# Patient Record
Sex: Female | Born: 1965
Health system: Southern US, Community
[De-identification: ages and names within clinical notes are randomized; demographics above are authoritative.]

## PROBLEM LIST (undated history)

## (undated) DIAGNOSIS — A630 Anogenital (venereal) warts: Secondary | ICD-10-CM

## (undated) DIAGNOSIS — R7303 Prediabetes: Secondary | ICD-10-CM

## (undated) DIAGNOSIS — K760 Fatty (change of) liver, not elsewhere classified: Secondary | ICD-10-CM

## (undated) DIAGNOSIS — E785 Hyperlipidemia, unspecified: Secondary | ICD-10-CM

## (undated) DIAGNOSIS — Z87442 Personal history of urinary calculi: Secondary | ICD-10-CM

## (undated) DIAGNOSIS — M1990 Unspecified osteoarthritis, unspecified site: Secondary | ICD-10-CM

## (undated) DIAGNOSIS — Z8489 Family history of other specified conditions: Secondary | ICD-10-CM

## (undated) DIAGNOSIS — I10 Essential (primary) hypertension: Secondary | ICD-10-CM

## (undated) DIAGNOSIS — G4730 Sleep apnea, unspecified: Secondary | ICD-10-CM

## (undated) DIAGNOSIS — R32 Unspecified urinary incontinence: Secondary | ICD-10-CM

## (undated) DIAGNOSIS — K589 Irritable bowel syndrome without diarrhea: Secondary | ICD-10-CM

## (undated) HISTORY — PX: EXTRACORPOREAL SHOCK WAVE LITHOTRIPSY: SHX1557

## (undated) HISTORY — DX: Irritable bowel syndrome without diarrhea: K58.9

## (undated) HISTORY — PX: EYE SURGERY: SHX253

## (undated) HISTORY — DX: Prediabetes: R73.03

## (undated) HISTORY — PX: APPENDECTOMY: SHX54

## (undated) HISTORY — DX: Essential (primary) hypertension: I10

## (undated) HISTORY — DX: Unspecified urinary incontinence: R32

## (undated) HISTORY — DX: Anogenital (venereal) warts: A63.0

## (undated) HISTORY — DX: Hyperlipidemia, unspecified: E78.5

## (undated) HISTORY — DX: Unspecified osteoarthritis, unspecified site: M19.90

## (undated) HISTORY — DX: Fatty (change of) liver, not elsewhere classified: K76.0

---

## 2001-02-09 ENCOUNTER — Ambulatory Visit (HOSPITAL_COMMUNITY): Admission: RE | Admit: 2001-02-09 | Discharge: 2001-02-09 | Payer: Self-pay | Admitting: Gastroenterology

## 2001-02-09 ENCOUNTER — Encounter (INDEPENDENT_AMBULATORY_CARE_PROVIDER_SITE_OTHER): Payer: Self-pay | Admitting: Specialist

## 2002-05-28 ENCOUNTER — Other Ambulatory Visit: Admission: RE | Admit: 2002-05-28 | Discharge: 2002-05-28 | Payer: Self-pay | Admitting: Obstetrics and Gynecology

## 2003-06-12 ENCOUNTER — Other Ambulatory Visit: Admission: RE | Admit: 2003-06-12 | Discharge: 2003-06-12 | Payer: Self-pay | Admitting: Obstetrics and Gynecology

## 2004-07-13 ENCOUNTER — Other Ambulatory Visit: Admission: RE | Admit: 2004-07-13 | Discharge: 2004-07-13 | Payer: Self-pay | Admitting: Obstetrics and Gynecology

## 2005-08-05 ENCOUNTER — Other Ambulatory Visit: Admission: RE | Admit: 2005-08-05 | Discharge: 2005-08-05 | Payer: Self-pay | Admitting: Obstetrics and Gynecology

## 2010-01-01 ENCOUNTER — Encounter: Admission: RE | Admit: 2010-01-01 | Discharge: 2010-01-01 | Payer: Self-pay | Admitting: Obstetrics and Gynecology

## 2014-11-28 HISTORY — PX: COLONOSCOPY: SHX174

## 2015-12-24 DIAGNOSIS — J069 Acute upper respiratory infection, unspecified: Secondary | ICD-10-CM | POA: Diagnosis not present

## 2015-12-24 DIAGNOSIS — J020 Streptococcal pharyngitis: Secondary | ICD-10-CM | POA: Diagnosis not present

## 2016-01-25 DIAGNOSIS — K080 Exfoliation of teeth due to systemic causes: Secondary | ICD-10-CM | POA: Diagnosis not present

## 2016-03-21 DIAGNOSIS — Z1389 Encounter for screening for other disorder: Secondary | ICD-10-CM | POA: Diagnosis not present

## 2016-03-21 DIAGNOSIS — R7301 Impaired fasting glucose: Secondary | ICD-10-CM | POA: Diagnosis not present

## 2016-03-21 DIAGNOSIS — I10 Essential (primary) hypertension: Secondary | ICD-10-CM | POA: Diagnosis not present

## 2016-03-21 DIAGNOSIS — E559 Vitamin D deficiency, unspecified: Secondary | ICD-10-CM | POA: Diagnosis not present

## 2016-03-21 DIAGNOSIS — E785 Hyperlipidemia, unspecified: Secondary | ICD-10-CM | POA: Diagnosis not present

## 2016-03-21 DIAGNOSIS — R809 Proteinuria, unspecified: Secondary | ICD-10-CM | POA: Diagnosis not present

## 2016-04-26 DIAGNOSIS — S61419A Laceration without foreign body of unspecified hand, initial encounter: Secondary | ICD-10-CM | POA: Diagnosis not present

## 2016-04-26 DIAGNOSIS — Z23 Encounter for immunization: Secondary | ICD-10-CM | POA: Diagnosis not present

## 2016-04-26 DIAGNOSIS — S61459A Open bite of unspecified hand, initial encounter: Secondary | ICD-10-CM | POA: Diagnosis not present

## 2016-05-03 DIAGNOSIS — S61451A Open bite of right hand, initial encounter: Secondary | ICD-10-CM | POA: Diagnosis not present

## 2016-05-03 DIAGNOSIS — W540XXA Bitten by dog, initial encounter: Secondary | ICD-10-CM | POA: Diagnosis not present

## 2016-07-07 ENCOUNTER — Ambulatory Visit (INDEPENDENT_AMBULATORY_CARE_PROVIDER_SITE_OTHER): Payer: Federal, State, Local not specified - PPO | Admitting: Obstetrics and Gynecology

## 2016-07-07 ENCOUNTER — Encounter: Payer: Self-pay | Admitting: Obstetrics and Gynecology

## 2016-07-07 VITALS — BP 122/74 | HR 70 | Resp 18 | Ht 65.0 in | Wt 249.0 lb

## 2016-07-07 DIAGNOSIS — E669 Obesity, unspecified: Secondary | ICD-10-CM | POA: Diagnosis not present

## 2016-07-07 DIAGNOSIS — N393 Stress incontinence (female) (male): Secondary | ICD-10-CM | POA: Diagnosis not present

## 2016-07-07 DIAGNOSIS — Z01419 Encounter for gynecological examination (general) (routine) without abnormal findings: Secondary | ICD-10-CM

## 2016-07-07 DIAGNOSIS — Z6841 Body Mass Index (BMI) 40.0 and over, adult: Secondary | ICD-10-CM | POA: Diagnosis not present

## 2016-07-07 DIAGNOSIS — IMO0001 Reserved for inherently not codable concepts without codable children: Secondary | ICD-10-CM

## 2016-07-07 NOTE — Patient Instructions (Signed)

## 2016-07-07 NOTE — Progress Notes (Signed)
51 y.o. G26P2012 Married Caucasian female here for annual exam.    Menses are not monthly.  LMP 07/03/16.  Lasted 5 days and had cramping.  Usually not more than 7 days.   Prior cycle was 96 days.  Prior cycle was 48 days.  Was monthly prior to that. Hot flashes for years.   Has low energy.  Takes Phentermine, and this helps.  Has lost a lot of weight in the past just on her own.  Did Weight Watchers in past and did not work.  Feels like she is retaining fluid in her legs.  States she may have fatty liver.  PCP:   W.Douglas Tomasa Blase, MD - [place], [place].  Patient's last menstrual period was 07/03/2016 (exact date).     Period Pattern: (!) Irregular     Sexually active: Yes.   female The current method of family planning is condoms sometimes.    Exercising: Yes.    cardio Smoker:  Former, quit 1996  Health Maintenance: Pap:  2015 normal per patient History of abnormal Pap:  No.  Hx of genital warts on her cervix. MMG:  2015 normal with [facility] Colonoscopy:  2014 normal in [place];next due 2019 BMD:   n/a  Result  n/a TDaP:  04/2016 Gardasil:   N/A Hep C:  Will d/w PCP.  Screening Labs:  Hb today: PCP, Urine today: unable to void   reports that she quit smoking about 22 years ago. Her smoking use included Cigarettes. She has never used smokeless tobacco. She reports that she does not drink alcohol or use drugs.  Past Medical History:  Diagnosis Date  . Genital warts    removed from cervix in her 20's  . Hypertension   . Urinary incontinence     Past Surgical History:  Procedure Laterality Date  . APPENDECTOMY      Current Outpatient Prescriptions  Medication Sig Dispense Refill  . benazepril (LOTENSIN) 20 MG tablet Take 1 tablet by mouth daily.    . naproxen (NAPROSYN) 500 MG tablet Take 1 tablet by mouth daily.    . phentermine (ADIPEX-P) 37.5 MG tablet Take 1 tablet by mouth daily.    . Vitamin D, Ergocalciferol, (DRISDOL) 50000 units CAPS capsule  Take 1 capsule by mouth once a week.    . vitamin E 400 UNIT capsule Take 400 Units by mouth daily.     No current facility-administered medications for this visit.     Family History  Problem Relation Age of Onset  . Cancer Mother 41    colon cancer  . Diabetes Mother   . Hypertension Mother   . Cancer Father 75    colon cancer  . Diabetes Father   . Stroke Father   . Heart failure Father   . Other Sister     died from overdose age 30    ROS:  Pertinent items are noted in HPI.  Otherwise, a comprehensive ROS was negative.  Exam:   BP 122/74 (BP Location: Left Arm, Patient Position: Sitting, Cuff Size: Large)   Pulse 70   Resp 18   Ht 5\' 5"  (1.651 m)   Wt 249 lb (112.9 kg)   LMP 07/03/2016 (Exact Date)   BMI 41.44 kg/m     General appearance: alert, cooperative and appears stated age Head: Normocephalic, without obvious abnormality, atraumatic Neck: no adenopathy, supple, symmetrical, trachea midline and thyroid normal to inspection and palpation Lungs: clear to auscultation bilaterally Breasts: normal appearance, no masses or  tenderness, No nipple retraction or dimpling, No nipple discharge or bleeding, No axillary or supraclavicular adenopathy Heart: regular rate and rhythm Abdomen: soft, non-tender; no masses, no organomegaly Extremities: extremities normal, atraumatic, no cyanosis or edema Skin: Skin color, texture, turgor normal. No rashes or lesions Lymph nodes: Cervical, supraclavicular, and axillary nodes normal. No abnormal inguinal nodes palpated Neurologic: Grossly normal  Pelvic: External genitalia:  no lesions              Urethra:  normal appearing urethra with no masses, tenderness or lesions              Bartholins and Skenes: normal                 Vagina: normal appearing vagina with normal color and discharge, no lesions, Minimal cystocele.              Cervix: no lesions              Pap taken: Yes.   Bimanual Exam:  Uterus:  normal size,  contour, position, consistency, mobility, non-tender              Adnexa: no mass, fullness, tenderness              Rectal exam: Yes.  .  Confirms.              Anus:  normal sphincter tone, no lesions  Chaperone was present for exam.  Assessment:   Well woman visit with normal exam. Obesity. Remote history of genital warts. Stress incontinence.   Plan: Mammogram screening discussed. Recommended self breast awareness. Pap and HR HPV as above. Guidelines for Calcium, Vitamin D, regular exercise program including cardiovascular and weight bearing exercise. Extensive discussion regarding weight loss - methods of weight loss, importance of both calorie restriction and exercise reviewed.  Benefits of weight loss to reduce risk of DVD, diabetes, urinary incontinence. Urinary incontinence discussed as well.  Etiologies reviewed.  Tx options of Kegels, pelvic floor PT, Impressa, midurethral sling and cystocele repair.  I also stressed the importance of weight loss as part of the plan.  Midurethral sling will have less effectiveness and patient will have higher chance of return of incontinence without weight loss first. Will refer to PT. Follow up annually and prn.   Additional counseling given.  Yes.  . ___20___ minutes face to face time of which over 50% was spent in counseling regarding stress incontinence and weight loss.   1 hour of physician face to face time was spent with the patient today.   After visit summary provided.

## 2016-07-08 ENCOUNTER — Encounter: Payer: Self-pay | Admitting: Obstetrics and Gynecology

## 2016-07-11 LAB — IPS PAP TEST WITH HPV

## 2016-08-08 DIAGNOSIS — K080 Exfoliation of teeth due to systemic causes: Secondary | ICD-10-CM | POA: Diagnosis not present

## 2016-09-12 DIAGNOSIS — E785 Hyperlipidemia, unspecified: Secondary | ICD-10-CM | POA: Diagnosis not present

## 2016-09-12 DIAGNOSIS — E559 Vitamin D deficiency, unspecified: Secondary | ICD-10-CM | POA: Diagnosis not present

## 2016-09-12 DIAGNOSIS — Z0000 Encounter for general adult medical examination without abnormal findings: Secondary | ICD-10-CM | POA: Diagnosis not present

## 2016-09-12 DIAGNOSIS — I10 Essential (primary) hypertension: Secondary | ICD-10-CM | POA: Diagnosis not present

## 2017-02-13 DIAGNOSIS — K080 Exfoliation of teeth due to systemic causes: Secondary | ICD-10-CM | POA: Diagnosis not present

## 2017-03-20 DIAGNOSIS — R7301 Impaired fasting glucose: Secondary | ICD-10-CM | POA: Diagnosis not present

## 2017-03-20 DIAGNOSIS — E559 Vitamin D deficiency, unspecified: Secondary | ICD-10-CM | POA: Diagnosis not present

## 2017-03-20 DIAGNOSIS — I10 Essential (primary) hypertension: Secondary | ICD-10-CM | POA: Diagnosis not present

## 2017-03-20 DIAGNOSIS — Z6841 Body Mass Index (BMI) 40.0 and over, adult: Secondary | ICD-10-CM | POA: Diagnosis not present

## 2017-03-20 DIAGNOSIS — Z1389 Encounter for screening for other disorder: Secondary | ICD-10-CM | POA: Diagnosis not present

## 2017-03-20 DIAGNOSIS — E785 Hyperlipidemia, unspecified: Secondary | ICD-10-CM | POA: Diagnosis not present

## 2017-04-25 DIAGNOSIS — M1812 Unilateral primary osteoarthritis of first carpometacarpal joint, left hand: Secondary | ICD-10-CM | POA: Diagnosis not present

## 2017-04-25 DIAGNOSIS — M1811 Unilateral primary osteoarthritis of first carpometacarpal joint, right hand: Secondary | ICD-10-CM | POA: Diagnosis not present

## 2017-05-15 DIAGNOSIS — G4700 Insomnia, unspecified: Secondary | ICD-10-CM | POA: Diagnosis not present

## 2017-05-15 DIAGNOSIS — R0683 Snoring: Secondary | ICD-10-CM | POA: Diagnosis not present

## 2017-06-01 DIAGNOSIS — G4733 Obstructive sleep apnea (adult) (pediatric): Secondary | ICD-10-CM | POA: Diagnosis not present

## 2017-06-09 DIAGNOSIS — Z6841 Body Mass Index (BMI) 40.0 and over, adult: Secondary | ICD-10-CM | POA: Diagnosis not present

## 2017-06-09 DIAGNOSIS — Z01419 Encounter for gynecological examination (general) (routine) without abnormal findings: Secondary | ICD-10-CM | POA: Diagnosis not present

## 2017-06-09 DIAGNOSIS — Z1231 Encounter for screening mammogram for malignant neoplasm of breast: Secondary | ICD-10-CM | POA: Diagnosis not present

## 2017-06-13 DIAGNOSIS — G4733 Obstructive sleep apnea (adult) (pediatric): Secondary | ICD-10-CM | POA: Diagnosis not present

## 2017-06-16 ENCOUNTER — Other Ambulatory Visit: Payer: Self-pay | Admitting: Obstetrics

## 2017-06-16 DIAGNOSIS — R928 Other abnormal and inconclusive findings on diagnostic imaging of breast: Secondary | ICD-10-CM

## 2017-06-22 ENCOUNTER — Ambulatory Visit
Admission: RE | Admit: 2017-06-22 | Discharge: 2017-06-22 | Disposition: A | Discharge status: Home / Self Care | Payer: Federal, State, Local not specified - PPO | Source: Ambulatory Visit | Attending: Obstetrics | Admitting: Obstetrics

## 2017-06-22 ENCOUNTER — Ambulatory Visit: Payer: Self-pay

## 2017-06-22 DIAGNOSIS — R928 Other abnormal and inconclusive findings on diagnostic imaging of breast: Secondary | ICD-10-CM

## 2017-07-14 DIAGNOSIS — G4733 Obstructive sleep apnea (adult) (pediatric): Secondary | ICD-10-CM | POA: Diagnosis not present

## 2017-07-19 ENCOUNTER — Ambulatory Visit: Payer: Federal, State, Local not specified - PPO | Admitting: Obstetrics and Gynecology

## 2017-08-14 DIAGNOSIS — G4733 Obstructive sleep apnea (adult) (pediatric): Secondary | ICD-10-CM | POA: Diagnosis not present

## 2017-08-22 DIAGNOSIS — K080 Exfoliation of teeth due to systemic causes: Secondary | ICD-10-CM | POA: Diagnosis not present

## 2017-09-11 DIAGNOSIS — G4733 Obstructive sleep apnea (adult) (pediatric): Secondary | ICD-10-CM | POA: Diagnosis not present

## 2017-09-18 DIAGNOSIS — Z6841 Body Mass Index (BMI) 40.0 and over, adult: Secondary | ICD-10-CM | POA: Diagnosis not present

## 2017-09-18 DIAGNOSIS — R7301 Impaired fasting glucose: Secondary | ICD-10-CM | POA: Diagnosis not present

## 2017-09-18 DIAGNOSIS — E559 Vitamin D deficiency, unspecified: Secondary | ICD-10-CM | POA: Diagnosis not present

## 2017-09-18 DIAGNOSIS — E785 Hyperlipidemia, unspecified: Secondary | ICD-10-CM | POA: Diagnosis not present

## 2017-09-18 DIAGNOSIS — I10 Essential (primary) hypertension: Secondary | ICD-10-CM | POA: Diagnosis not present

## 2017-09-18 DIAGNOSIS — R809 Proteinuria, unspecified: Secondary | ICD-10-CM | POA: Diagnosis not present

## 2017-09-20 DIAGNOSIS — G4733 Obstructive sleep apnea (adult) (pediatric): Secondary | ICD-10-CM | POA: Diagnosis not present

## 2017-09-25 DIAGNOSIS — G4733 Obstructive sleep apnea (adult) (pediatric): Secondary | ICD-10-CM | POA: Diagnosis not present

## 2017-09-26 DIAGNOSIS — G4733 Obstructive sleep apnea (adult) (pediatric): Secondary | ICD-10-CM | POA: Diagnosis not present

## 2017-10-12 DIAGNOSIS — G4733 Obstructive sleep apnea (adult) (pediatric): Secondary | ICD-10-CM | POA: Diagnosis not present

## 2017-10-30 DIAGNOSIS — Z780 Asymptomatic menopausal state: Secondary | ICD-10-CM | POA: Diagnosis not present

## 2017-11-01 ENCOUNTER — Encounter: Payer: Self-pay | Admitting: Gastroenterology

## 2017-11-11 DIAGNOSIS — G4733 Obstructive sleep apnea (adult) (pediatric): Secondary | ICD-10-CM | POA: Diagnosis not present

## 2017-12-12 DIAGNOSIS — G4733 Obstructive sleep apnea (adult) (pediatric): Secondary | ICD-10-CM | POA: Diagnosis not present

## 2017-12-22 DIAGNOSIS — T63301A Toxic effect of unspecified spider venom, accidental (unintentional), initial encounter: Secondary | ICD-10-CM | POA: Diagnosis not present

## 2018-01-02 DIAGNOSIS — G4733 Obstructive sleep apnea (adult) (pediatric): Secondary | ICD-10-CM | POA: Diagnosis not present

## 2018-01-11 DIAGNOSIS — G4733 Obstructive sleep apnea (adult) (pediatric): Secondary | ICD-10-CM | POA: Diagnosis not present

## 2018-03-26 DIAGNOSIS — R809 Proteinuria, unspecified: Secondary | ICD-10-CM | POA: Diagnosis not present

## 2018-03-26 DIAGNOSIS — Z1331 Encounter for screening for depression: Secondary | ICD-10-CM | POA: Diagnosis not present

## 2018-03-26 DIAGNOSIS — Z1339 Encounter for screening examination for other mental health and behavioral disorders: Secondary | ICD-10-CM | POA: Diagnosis not present

## 2018-03-26 DIAGNOSIS — I10 Essential (primary) hypertension: Secondary | ICD-10-CM | POA: Diagnosis not present

## 2018-03-26 DIAGNOSIS — E785 Hyperlipidemia, unspecified: Secondary | ICD-10-CM | POA: Diagnosis not present

## 2018-03-26 DIAGNOSIS — E559 Vitamin D deficiency, unspecified: Secondary | ICD-10-CM | POA: Diagnosis not present

## 2018-03-26 DIAGNOSIS — Z0000 Encounter for general adult medical examination without abnormal findings: Secondary | ICD-10-CM | POA: Diagnosis not present

## 2018-03-28 DIAGNOSIS — K080 Exfoliation of teeth due to systemic causes: Secondary | ICD-10-CM | POA: Diagnosis not present

## 2018-06-11 DIAGNOSIS — G4733 Obstructive sleep apnea (adult) (pediatric): Secondary | ICD-10-CM | POA: Diagnosis not present

## 2018-07-11 DIAGNOSIS — Z1231 Encounter for screening mammogram for malignant neoplasm of breast: Secondary | ICD-10-CM | POA: Diagnosis not present

## 2018-07-11 DIAGNOSIS — Z124 Encounter for screening for malignant neoplasm of cervix: Secondary | ICD-10-CM | POA: Diagnosis not present

## 2018-07-11 DIAGNOSIS — Z01419 Encounter for gynecological examination (general) (routine) without abnormal findings: Secondary | ICD-10-CM | POA: Diagnosis not present

## 2018-07-11 DIAGNOSIS — Z6841 Body Mass Index (BMI) 40.0 and over, adult: Secondary | ICD-10-CM | POA: Diagnosis not present

## 2018-07-31 ENCOUNTER — Encounter: Payer: Self-pay | Admitting: Gastroenterology

## 2018-07-31 ENCOUNTER — Ambulatory Visit: Payer: Federal, State, Local not specified - PPO | Admitting: Gastroenterology

## 2018-08-01 ENCOUNTER — Ambulatory Visit: Payer: Federal, State, Local not specified - PPO | Admitting: Gastroenterology

## 2018-08-01 ENCOUNTER — Encounter: Payer: Self-pay | Admitting: Gastroenterology

## 2018-08-01 VITALS — BP 128/78 | HR 72 | Ht 65.0 in | Wt 266.1 lb

## 2018-08-01 DIAGNOSIS — R1032 Left lower quadrant pain: Secondary | ICD-10-CM | POA: Diagnosis not present

## 2018-08-01 MED ORDER — METRONIDAZOLE 500 MG PO TABS: 500.0000 mg | ORAL_TABLET | Freq: Two times a day (BID) | ORAL | 0 refills | Status: AC

## 2018-08-01 MED ORDER — CIPROFLOXACIN HCL 500 MG PO TABS: 500.0000 mg | ORAL_TABLET | Freq: Two times a day (BID) | ORAL | 0 refills | Status: AC

## 2018-08-01 MED ORDER — FLUCONAZOLE 100 MG PO TABS: 100.0000 mg | ORAL_TABLET | Freq: Once | ORAL | 0 refills | Status: AC

## 2018-08-01 NOTE — Progress Notes (Signed)
Chief Complaint: Left lower quad abdominal pain.  Referring Provider:  Paulina Fusi, MD      ASSESSMENT AND PLAN;   #1. LLQ pain -highly s/o diverticulitis.  Rule out other causes.  #2. FH of colon cancer (mother and father at appox age 55). Neg colon 11/2014 except for moderate sigmoid diverticulosis.   Plan: - Start cipro 500mg  po bid and flagyl 500mg  po tid x 10 days. - Diflucan 200mg  po 1 tab (as she has Candida vaginitis when she takes antibiotics).  This is to be used only if she starts having any problems. - Call in 2 weeks. - If still with problems, check CBC, CMP, NCCT (d/t h/o kidney stones) followed by CECT abdo/pelvis. - Colon 11/2019, Earlier if still with problems.    HPI:    Brandi Simmons is a 53 y.o. female  With left lower quadrant abdominal pain-intermittently over the last 2 to 3 months. Nonradiating Had one episode of chills Denies having any significant diarrhea or constipation No abdominal bloating No melena or hematochezia Has been given dicyclomine without any significant relief.  Gets nauseated when the pain is severe.  No vomiting.  No weight loss.  -GI procedures: Colonoscopy 11/2014 moderate sigmoid diverticulosis.  Was recommended to repeat in 3 years.  However, patient agreeable to repeat in 5 years.  Certainly, earlier if still with problems. Past Medical History:  Diagnosis Date  . Borderline diabetes mellitus   . Fatty liver   . Genital warts    removed from cervix in her 20's  . HTN (hypertension)   . Hyperlipidemia   . Hypertension   . IBS (irritable bowel syndrome)   . Osteoarthritis   . Urinary incontinence     Past Surgical History:  Procedure Laterality Date  . APPENDECTOMY    . COLONOSCOPY  11/28/2014   Moderate sigmoid diverticilosis. Small internal hemorrhoids. Otherwise normal colonoscopy.     Family History  Problem Relation Age of Onset  . Cancer Mother 70       colon cancer  . Diabetes Mother   .  Hypertension Mother   . Cancer Father 57       colon cancer  . Diabetes Father   . Stroke Father   . Heart failure Father   . Other Sister        died from overdose age 22  . Esophageal cancer Neg Hx   . Breast cancer Neg Hx     Social History   Tobacco Use  . Smoking status: Former Smoker    Types: Cigarettes    Last attempt to quit: 07/04/1994    Years since quitting: 24.0  . Smokeless tobacco: Never Used  Substance Use Topics  . Alcohol use: No  . Drug use: No    Current Outpatient Medications  Medication Sig Dispense Refill  . benazepril (LOTENSIN) 20 MG tablet Take 1 tablet by mouth daily.    . naproxen (NAPROSYN) 500 MG tablet Take 1 tablet by mouth daily.    . phentermine (ADIPEX-P) 37.5 MG tablet Take 1 tablet by mouth daily.    . Vitamin D, Ergocalciferol, (DRISDOL) 50000 units CAPS capsule Take 1 capsule by mouth once a week.    . dicyclomine (BENTYL) 20 MG tablet as needed.      No current facility-administered medications for this visit.     No Known Allergies  Review of Systems:  Constitutional: Denies fever, chills, diaphoresis, appetite change and fatigue.  HEENT: Denies photophobia, eye  pain, redness, hearing loss, ear pain, congestion, sore throat, rhinorrhea, sneezing, mouth sores, neck pain, neck stiffness and tinnitus.   Respiratory: Denies SOB, DOE, cough, chest tightness,  and wheezing.   Cardiovascular: Denies chest pain, palpitations and leg swelling.  Genitourinary: Denies dysuria, urgency, frequency, hematuria, flank pain and difficulty urinating.  Musculoskeletal: Denies myalgias, back pain, joint swelling, arthralgias and gait problem.  Skin: No rash.  Neurological: Denies dizziness, seizures, syncope, weakness, light-headedness, numbness and headaches.  Hematological: Denies adenopathy. Easy bruising, personal or family bleeding history  Psychiatric/Behavioral:Has anxiety, no depression     Physical Exam:    BP 128/78   Pulse 72   Ht  5\' 5"  (1.651 m)   Wt 266 lb 2 oz (120.7 kg)   BMI 44.29 kg/m  Filed Weights   08/01/18 1059  Weight: 266 lb 2 oz (120.7 kg)   Constitutional:  Well-developed, in no acute distress. Psychiatric: Normal mood and affect. Behavior is normal. HEENT: Pupils normal.  Conjunctivae are normal. No scleral icterus. Neck supple.  Cardiovascular: Normal rate, regular rhythm. No edema Pulmonary/chest: Effort normal and breath sounds normal. No wheezing, rales or rhonchi. Abdominal: Soft, nondistended.  Mild left lower quadrant tenderness without rebound. Bowel sounds active throughout. There are no masses palpable. No hepatomegaly. Rectal:  defered Neurological: Alert and oriented to person place and time. Skin: Skin is warm and dry. No rashes noted.    Edman Circle, MD 08/01/2018, 11:09 AM  Cc: Paulina Fusi, MD

## 2018-08-01 NOTE — Patient Instructions (Addendum)
If you are age 55 or older, your body mass index should be between 23-30. Your Body mass index is 44.29 kg/m. If this is out of the aforementioned range listed, please consider follow up with your Primary Care Provider.  If you are age 69 or younger, your body mass index should be between 19-25. Your Body mass index is 44.29 kg/m. If this is out of the aformentioned range listed, please consider follow up with your Primary Care Provider.   We have sent the following medications to your pharmacy for you to pick up at your convenience: Cipro Flagyl Diflucan  Please call Dr. Donne Hazel nurse Veronia Beets, RN)  in 2 weeks at [phone]  to let her now how you are doing.   You will be due for a recall colonoscopy in 11/2019. We will send you a reminder in the mail when it gets closer to that time.   Thank you,  Dr. Lynann Bologna

## 2018-08-13 ENCOUNTER — Telehealth: Payer: Self-pay | Admitting: Gastroenterology

## 2018-08-13 NOTE — Telephone Encounter (Signed)
[facility] OF THE DAY Brandi Simmons patient-patient called into office to report she is still having LLQ problems and wishes to proceed with the next step in the plan of care that Dr. Chales Simmons "told her about"; patient was informed the DOD would be notified of situation and patient would receive a phone call in return-  Patient prematurely scheduled for lab work at [facility] for CBC/CMP/NCCT;  Orders NOT placed in Epic at this time;  Please advise as the last OV note on 08/01/2018 stated: Call in 2 weeks. - If still with problems, check CBC, CMP, NCCT (d/t h/o kidney stones) followed by CECT abdo/pelvis.

## 2018-08-14 ENCOUNTER — Ambulatory Visit (HOSPITAL_BASED_OUTPATIENT_CLINIC_OR_DEPARTMENT_OTHER)
Admission: RE | Admit: 2018-08-14 | Discharge: 2018-08-14 | Disposition: A | Discharge status: Home / Self Care | Payer: Federal, State, Local not specified - PPO | Source: Ambulatory Visit | Attending: Gastroenterology | Admitting: Gastroenterology

## 2018-08-14 ENCOUNTER — Other Ambulatory Visit (INDEPENDENT_AMBULATORY_CARE_PROVIDER_SITE_OTHER): Payer: Federal, State, Local not specified - PPO

## 2018-08-14 ENCOUNTER — Other Ambulatory Visit: Payer: Self-pay

## 2018-08-14 DIAGNOSIS — N200 Calculus of kidney: Secondary | ICD-10-CM | POA: Diagnosis not present

## 2018-08-14 DIAGNOSIS — R1032 Left lower quadrant pain: Secondary | ICD-10-CM

## 2018-08-14 LAB — CBC WITH DIFFERENTIAL/PLATELET
BASOS ABS: 0.1 10*3/uL (ref 0.0–0.1)
Basophils Relative: 0.9 % (ref 0.0–3.0)
EOS ABS: 0.3 10*3/uL (ref 0.0–0.7)
Eosinophils Relative: 3.4 % (ref 0.0–5.0)
HEMATOCRIT: 40.9 % (ref 36.0–46.0)
Hemoglobin: 13.8 g/dL (ref 12.0–15.0)
LYMPHS PCT: 30.1 % (ref 12.0–46.0)
Lymphs Abs: 2.5 10*3/uL (ref 0.7–4.0)
MCHC: 33.7 g/dL (ref 30.0–36.0)
MCV: 86.3 fl (ref 78.0–100.0)
MONOS PCT: 5.1 % (ref 3.0–12.0)
Monocytes Absolute: 0.4 10*3/uL (ref 0.1–1.0)
Neutro Abs: 5 10*3/uL (ref 1.4–7.7)
Neutrophils Relative %: 60.5 % (ref 43.0–77.0)
Platelets: 271 10*3/uL (ref 150.0–400.0)
RBC: 4.74 Mil/uL (ref 3.87–5.11)
RDW: 12.8 % (ref 11.5–15.5)
WBC: 8.3 10*3/uL (ref 4.0–10.5)

## 2018-08-14 LAB — COMPREHENSIVE METABOLIC PANEL
ALBUMIN: 4.1 g/dL (ref 3.5–5.2)
ALK PHOS: 81 U/L (ref 39–117)
ALT: 20 U/L (ref 0–35)
AST: 18 U/L (ref 0–37)
BILIRUBIN TOTAL: 0.5 mg/dL (ref 0.2–1.2)
BUN: 15 mg/dL (ref 6–23)
CALCIUM: 9.4 mg/dL (ref 8.4–10.5)
CO2: 27 mEq/L (ref 19–32)
CREATININE: 0.72 mg/dL (ref 0.40–1.20)
Chloride: 101 mEq/L (ref 96–112)
GFR: 84.2 mL/min (ref >60.00)
Glucose, Bld: 145 mg/dL — ABNORMAL HIGH (ref 70–99)
Potassium: 4.1 mEq/L (ref 3.5–5.1)
Sodium: 137 mEq/L (ref 135–145)
TOTAL PROTEIN: 6.7 g/dL (ref 6.0–8.3)

## 2018-08-14 NOTE — Telephone Encounter (Signed)
Called and spoke with patient-patient informed of MD (DOD) recommendations and patient is agreeable with plan of care; patient was informed of lab work being requested and appt being scheduled for 08/14/2018 arrival at 1:00pm and appt at 1:15pm; patient is also aware of non-contrast CT abd/pelvis being scheduled 08/14/2018 at 2:00pm; Patient verbalized understanding of information/instructions; Patient was advised to call back if questions/concerns arise;

## 2018-08-14 NOTE — Telephone Encounter (Signed)
Previous notes reviewed from last appointment Dr. Chales Abrahams.  Given ongoing LLQ pain despite treatment with appropriate course of antibiotics, agree with further serologic and radiographic evaluation as previously described by Dr. Chales Abrahams  -Check CBC, CMP - Cross-sectional imaging as previously requested by Dr. Chales Abrahams to evaluate for both kidney stones along with diverticulitis, diverticular associated colitis, inflammatory changes, etc.  He had requested for noncontrast CT to evaluate for kidney stones, and presumably if unrevealing would proceed with contrasted CT abdomen/pelvis to evaluate for GI/luminal etiology.  Please order as such per his request and previous notes.  Thank you

## 2018-08-15 ENCOUNTER — Telehealth: Payer: Self-pay | Admitting: Gastroenterology

## 2018-08-15 NOTE — Telephone Encounter (Signed)
Please review previous message and advise 

## 2018-08-15 NOTE — Telephone Encounter (Signed)
Patient wants ct results

## 2018-08-16 NOTE — Telephone Encounter (Signed)
Patient contacted by Woodroe Mode and referral placed.

## 2018-08-21 DIAGNOSIS — R8271 Bacteriuria: Secondary | ICD-10-CM | POA: Diagnosis not present

## 2018-08-21 DIAGNOSIS — N200 Calculus of kidney: Secondary | ICD-10-CM | POA: Diagnosis not present

## 2018-08-22 ENCOUNTER — Encounter (HOSPITAL_BASED_OUTPATIENT_CLINIC_OR_DEPARTMENT_OTHER): Payer: Self-pay | Admitting: *Deleted

## 2018-08-22 ENCOUNTER — Other Ambulatory Visit: Payer: Self-pay

## 2018-08-22 NOTE — Progress Notes (Signed)
Spoke w pt.  Hx obtained.  Instructed pt to be NPO p MN tonight (no candy, mint, or gum).  To [facility] 2/10 @ 0700.cbc, cmet in epic.  Needs urine hcg on arrival. No  Orders. unable to notify office as it is closed.

## 2018-08-23 ENCOUNTER — Other Ambulatory Visit: Payer: Self-pay

## 2018-08-23 ENCOUNTER — Encounter (HOSPITAL_BASED_OUTPATIENT_CLINIC_OR_DEPARTMENT_OTHER)
Admission: RE | Disposition: A | Discharge status: Home / Self Care | Payer: Self-pay | Source: Home / Self Care | Attending: Urology

## 2018-08-23 ENCOUNTER — Encounter (HOSPITAL_BASED_OUTPATIENT_CLINIC_OR_DEPARTMENT_OTHER): Payer: Self-pay | Admitting: Anesthesiology

## 2018-08-23 ENCOUNTER — Ambulatory Visit (HOSPITAL_BASED_OUTPATIENT_CLINIC_OR_DEPARTMENT_OTHER): Payer: Federal, State, Local not specified - PPO | Admitting: Anesthesiology

## 2018-08-23 ENCOUNTER — Ambulatory Visit (HOSPITAL_BASED_OUTPATIENT_CLINIC_OR_DEPARTMENT_OTHER)
Admission: RE | Admit: 2018-08-23 | Discharge: 2018-08-23 | Disposition: A | Discharge status: Home / Self Care | Payer: Federal, State, Local not specified - PPO | Attending: Urology | Admitting: Urology

## 2018-08-23 DIAGNOSIS — Z87891 Personal history of nicotine dependence: Secondary | ICD-10-CM | POA: Diagnosis not present

## 2018-08-23 DIAGNOSIS — G4730 Sleep apnea, unspecified: Secondary | ICD-10-CM | POA: Insufficient documentation

## 2018-08-23 DIAGNOSIS — Z6841 Body Mass Index (BMI) 40.0 and over, adult: Secondary | ICD-10-CM | POA: Insufficient documentation

## 2018-08-23 DIAGNOSIS — Z791 Long term (current) use of non-steroidal anti-inflammatories (NSAID): Secondary | ICD-10-CM | POA: Insufficient documentation

## 2018-08-23 DIAGNOSIS — N202 Calculus of kidney with calculus of ureter: Secondary | ICD-10-CM | POA: Diagnosis not present

## 2018-08-23 DIAGNOSIS — Z8249 Family history of ischemic heart disease and other diseases of the circulatory system: Secondary | ICD-10-CM | POA: Insufficient documentation

## 2018-08-23 DIAGNOSIS — N201 Calculus of ureter: Secondary | ICD-10-CM | POA: Diagnosis not present

## 2018-08-23 DIAGNOSIS — I10 Essential (primary) hypertension: Secondary | ICD-10-CM | POA: Diagnosis not present

## 2018-08-23 DIAGNOSIS — M1990 Unspecified osteoarthritis, unspecified site: Secondary | ICD-10-CM | POA: Insufficient documentation

## 2018-08-23 DIAGNOSIS — N200 Calculus of kidney: Secondary | ICD-10-CM

## 2018-08-23 HISTORY — PX: CYSTOSCOPY/URETEROSCOPY/HOLMIUM LASER/STENT PLACEMENT: SHX6546

## 2018-08-23 HISTORY — DX: Prediabetes: R73.03

## 2018-08-23 HISTORY — DX: Sleep apnea, unspecified: G47.30

## 2018-08-23 HISTORY — DX: Family history of other specified conditions: Z84.89

## 2018-08-23 HISTORY — DX: Personal history of urinary calculi: Z87.442

## 2018-08-23 LAB — POCT PREGNANCY, URINE: Preg Test, Ur: NEGATIVE

## 2018-08-23 SURGERY — CYSTOSCOPY/URETEROSCOPY/HOLMIUM LASER/STENT PLACEMENT
Anesthesia: General | Site: Ureter | Laterality: Left

## 2018-08-23 MED ORDER — DEXAMETHASONE SODIUM PHOSPHATE 4 MG/ML IJ SOLN
INTRAMUSCULAR | Status: DC | PRN
  Administered 2018-08-23: 10 mg via INTRAVENOUS

## 2018-08-23 MED ORDER — CIPROFLOXACIN HCL 500 MG PO TABS: 500.0000 mg | ORAL_TABLET | Freq: Once | ORAL | 0 refills | Status: AC

## 2018-08-23 MED ORDER — FENTANYL CITRATE (PF) 100 MCG/2ML IJ SOLN
INTRAMUSCULAR | Status: AC
  Filled 2018-08-23: qty 2

## 2018-08-23 MED ORDER — PROPOFOL 10 MG/ML IV BOLUS
INTRAVENOUS | Status: AC
  Filled 2018-08-23: qty 40

## 2018-08-23 MED ORDER — MIDAZOLAM HCL 5 MG/5ML IJ SOLN: INTRAMUSCULAR | Status: DC | PRN

## 2018-08-23 MED ORDER — OXYCODONE HCL 5 MG PO TABS
5.0000 mg | ORAL_TABLET | Freq: Once | ORAL | Status: AC | PRN
  Administered 2018-08-23: 5 mg via ORAL
  Filled 2018-08-23: qty 1

## 2018-08-23 MED ORDER — TRAMADOL HCL 50 MG PO TABS: 50.0000 mg | ORAL_TABLET | Freq: Four times a day (QID) | ORAL | 0 refills | Status: DC | PRN

## 2018-08-23 MED ORDER — KETOROLAC TROMETHAMINE 30 MG/ML IJ SOLN
INTRAMUSCULAR | Status: DC | PRN
  Administered 2018-08-23: 30 mg via INTRAVENOUS

## 2018-08-23 MED ORDER — ACETAMINOPHEN 500 MG PO TABS
1000.0000 mg | ORAL_TABLET | Freq: Once | ORAL | Status: DC | PRN
  Filled 2018-08-23: qty 2

## 2018-08-23 MED ORDER — FENTANYL CITRATE (PF) 100 MCG/2ML IJ SOLN
25.0000 ug | INTRAMUSCULAR | Status: DC | PRN
  Administered 2018-08-23: 25 ug via INTRAVENOUS
  Filled 2018-08-23: qty 1

## 2018-08-23 MED ORDER — FENTANYL CITRATE (PF) 100 MCG/2ML IJ SOLN
INTRAMUSCULAR | Status: DC | PRN
  Administered 2018-08-23: 25 ug via INTRAVENOUS
  Administered 2018-08-23: 50 ug via INTRAVENOUS
  Administered 2018-08-23: 25 ug via INTRAVENOUS

## 2018-08-23 MED ORDER — PHENAZOPYRIDINE HCL 200 MG PO TABS: 200.0000 mg | ORAL_TABLET | Freq: Three times a day (TID) | ORAL | 0 refills | Status: DC | PRN

## 2018-08-23 MED ORDER — ACETAMINOPHEN 10 MG/ML IV SOLN
1000.0000 mg | Freq: Once | INTRAVENOUS | Status: DC | PRN
  Filled 2018-08-23: qty 100

## 2018-08-23 MED ORDER — MIDAZOLAM HCL 2 MG/2ML IJ SOLN
INTRAMUSCULAR | Status: AC
  Filled 2018-08-23: qty 2

## 2018-08-23 MED ORDER — PHENAZOPYRIDINE HCL 100 MG PO TABS
ORAL_TABLET | ORAL | Status: AC
  Filled 2018-08-23: qty 2

## 2018-08-23 MED ORDER — BELLADONNA ALKALOIDS-OPIUM 16.2-60 MG RE SUPP
RECTAL | Status: AC
  Filled 2018-08-23: qty 1

## 2018-08-23 MED ORDER — ACETAMINOPHEN 160 MG/5ML PO SOLN
1000.0000 mg | Freq: Once | ORAL | Status: DC | PRN
  Filled 2018-08-23: qty 40.6

## 2018-08-23 MED ORDER — ONDANSETRON HCL 4 MG/2ML IJ SOLN
INTRAMUSCULAR | Status: AC
  Filled 2018-08-23: qty 2

## 2018-08-23 MED ORDER — CEFAZOLIN (ANCEF) 1 G IV SOLR
2.0000 g | INTRAVENOUS | Status: AC
  Administered 2018-08-23: 2 g
  Filled 2018-08-23: qty 2

## 2018-08-23 MED ORDER — IOHEXOL 300 MG/ML  SOLN
INTRAMUSCULAR | Status: DC | PRN
  Administered 2018-08-23: 18 mL via URETHRAL

## 2018-08-23 MED ORDER — LACTATED RINGERS IV SOLN
INTRAVENOUS | Status: DC
  Administered 2018-08-23: 10:00:00 via INTRAVENOUS
  Administered 2018-08-23: 10000 mL via INTRAVENOUS
  Filled 2018-08-23: qty 1000

## 2018-08-23 MED ORDER — KETOROLAC TROMETHAMINE 30 MG/ML IJ SOLN
INTRAMUSCULAR | Status: AC
  Filled 2018-08-23: qty 1

## 2018-08-23 MED ORDER — ARTIFICIAL TEARS OPHTHALMIC OINT
TOPICAL_OINTMENT | OPHTHALMIC | Status: AC
  Filled 2018-08-23: qty 3.5

## 2018-08-23 MED ORDER — ONDANSETRON HCL 4 MG/2ML IJ SOLN
INTRAMUSCULAR | Status: DC | PRN
  Administered 2018-08-23: 4 mg via INTRAVENOUS

## 2018-08-23 MED ORDER — CEFAZOLIN SODIUM-DEXTROSE 2-4 GM/100ML-% IV SOLN
INTRAVENOUS | Status: AC
  Filled 2018-08-23: qty 100

## 2018-08-23 MED ORDER — OXYCODONE HCL 5 MG PO TABS
ORAL_TABLET | ORAL | Status: AC
  Filled 2018-08-23: qty 1

## 2018-08-23 MED ORDER — BELLADONNA ALKALOIDS-OPIUM 16.2-60 MG RE SUPP
RECTAL | Status: DC | PRN
  Administered 2018-08-23: 1 via RECTAL

## 2018-08-23 MED ORDER — PHENAZOPYRIDINE HCL 200 MG PO TABS
200.0000 mg | ORAL_TABLET | Freq: Three times a day (TID) | ORAL | Status: DC
  Administered 2018-08-23: 200 mg via ORAL
  Filled 2018-08-23: qty 1

## 2018-08-23 MED ORDER — LIDOCAINE 2% (20 MG/ML) 5 ML SYRINGE
INTRAMUSCULAR | Status: DC | PRN
  Administered 2018-08-23: 60 mg via INTRAVENOUS

## 2018-08-23 MED ORDER — LIDOCAINE 2% (20 MG/ML) 5 ML SYRINGE
INTRAMUSCULAR | Status: AC
  Filled 2018-08-23: qty 5

## 2018-08-23 MED ORDER — SODIUM CHLORIDE 0.9 % IR SOLN
Status: DC | PRN
  Administered 2018-08-23: 6000 mL

## 2018-08-23 MED ORDER — OXYCODONE HCL 5 MG/5ML PO SOLN
5.0000 mg | Freq: Once | ORAL | Status: AC | PRN
  Filled 2018-08-23: qty 5

## 2018-08-23 MED ORDER — PROPOFOL 10 MG/ML IV BOLUS
INTRAVENOUS | Status: DC | PRN
  Administered 2018-08-23 (×2): 20 mg via INTRAVENOUS
  Administered 2018-08-23: 200 mg via INTRAVENOUS

## 2018-08-23 MED ORDER — DEXAMETHASONE SODIUM PHOSPHATE 10 MG/ML IJ SOLN
INTRAMUSCULAR | Status: AC
  Filled 2018-08-23: qty 1

## 2018-08-23 MED ORDER — MIDAZOLAM HCL 5 MG/5ML IJ SOLN
INTRAMUSCULAR | Status: DC | PRN
  Administered 2018-08-23: 2 mg via INTRAVENOUS

## 2018-08-23 MED ORDER — EPHEDRINE SULFATE-NACL 50-0.9 MG/10ML-% IV SOSY
PREFILLED_SYRINGE | INTRAVENOUS | Status: DC | PRN
  Administered 2018-08-23: 20 mg via INTRAVENOUS
  Administered 2018-08-23 (×2): 10 mg via INTRAVENOUS

## 2018-08-23 SURGICAL SUPPLY — 28 items
BAG DRAIN URO-CYSTO SKYTR STRL (DRAIN) ×2 IMPLANT
BAG DRN UROCATH (DRAIN) ×1
BASKET LASER NITINOL 1.9FR (BASKET) IMPLANT
BASKET STONE 1.7 NGAGE (UROLOGICAL SUPPLIES) ×1 IMPLANT
BSKT STON RTRVL 120 1.9FR (BASKET)
CATH URET 5FR 28IN OPEN ENDED (CATHETERS) ×2 IMPLANT
CATH URET DUAL LUMEN 6-10FR 50 (CATHETERS) IMPLANT
CLOTH BEACON ORANGE TIMEOUT ST (SAFETY) ×2 IMPLANT
EXTRACTOR STONE 1.7FRX115CM (UROLOGICAL SUPPLIES) IMPLANT
FIBER LASER TRAC TIP (UROLOGICAL SUPPLIES) ×1 IMPLANT
GLOVE BIO SURGEON STRL SZ 6.5 (GLOVE) ×1 IMPLANT
GLOVE BIO SURGEON STRL SZ7.5 (GLOVE) ×2 IMPLANT
GLOVE BIOGEL PI IND STRL 6.5 (GLOVE) ×1 IMPLANT
GLOVE BIOGEL PI INDICATOR 6.5 (GLOVE) ×1
GOWN STRL REUS W/TWL LRG LVL3 (GOWN DISPOSABLE) ×2 IMPLANT
GOWN STRL REUS W/TWL XL LVL3 (GOWN DISPOSABLE) ×2 IMPLANT
GUIDEWIRE ANG ZIPWIRE 038X150 (WIRE) IMPLANT
GUIDEWIRE STR DUAL SENSOR (WIRE) ×4 IMPLANT
INFUSOR MANOMETER BAG 3000ML (MISCELLANEOUS) IMPLANT
IV NS IRRIG 3000ML ARTHROMATIC (IV SOLUTION) ×4 IMPLANT
KIT TURNOVER CYSTO (KITS) ×2 IMPLANT
MANIFOLD NEPTUNE II (INSTRUMENTS) ×1 IMPLANT
NS IRRIG 500ML POUR BTL (IV SOLUTION) ×2 IMPLANT
PACK CYSTO (CUSTOM PROCEDURE TRAY) ×2 IMPLANT
SHEATH URETERAL 12FRX35CM (MISCELLANEOUS) ×1 IMPLANT
STENT URET 6FRX24 CONTOUR (STENTS) ×1 IMPLANT
TUBE CONNECTING 12X1/4 (SUCTIONS) ×2 IMPLANT
TUBING UROLOGY SET (TUBING) ×2 IMPLANT

## 2018-08-23 NOTE — Transfer of Care (Signed)
   Last Vitals:  Vitals Value Taken Time  BP 136/71 08/23/2018 10:17 AM  Temp 36.4 C 08/23/2018 10:17 AM  Pulse 80 08/23/2018 10:21 AM  Resp 17 08/23/2018 10:21 AM  SpO2 97 % 08/23/2018 10:21 AM  Vitals shown include unvalidated device data.  Last Pain:  Vitals:   08/23/18 1017  TempSrc:   PainSc: Asleep      Patients Stated Pain Goal: 6 (08/23/18 0744) Immediate Anesthesia Transfer of Care Note  Patient: Brandi Simmons  Procedure(s) Performed: Procedure(s) (LRB): LEFT URETEROSCOPY/HOLMIUM LASER/STENT PLACEMENT (Left)  Patient Location: PACU  Anesthesia Type: General  Level of Consciousness: awake, alert  and oriented  Airway & Oxygen Therapy: Patient Spontanous Breathing and Patient connected to nasal cannula  oxygen  Post-op Assessment: Report given to PACU RN and Post -op Vital signs reviewed and stable  Post vital signs: Reviewed and stable  Complications: No apparent anesthesia complications

## 2018-08-23 NOTE — Interval H&P Note (Signed)
History and Physical Interval Note:  08/23/2018 8:55 AM  Brandi Simmons  has presented today for surgery, with the diagnosis of LEFT URETEROPELVIC JUNCTION STONE  The various methods of treatment have been discussed with the patient and family. After consideration of risks, benefits and other options for treatment, the patient has consented to  Procedure(s): LEFT URETEROSCOPY/HOLMIUM LASER/STENT PLACEMENT (Left) as a surgical intervention .  The patient's history has been reviewed, patient examined, no change in status, stable for surgery.  I have reviewed the patient's chart and labs.  Questions were answered to the patient's satisfaction.     Crist Fat

## 2018-08-23 NOTE — Anesthesia Preprocedure Evaluation (Addendum)
Anesthesia Evaluation  Patient identified by MRN, date of birth, ID band Patient awake    Reviewed: Allergy & Precautions, NPO status , Patient's Chart, lab work & pertinent test results  History of Anesthesia Complications Negative for: history of anesthetic complications  Airway Mallampati: II  TM Distance: >3 FB Neck ROM: Full    Dental  (+) Dental Advisory Given   Pulmonary sleep apnea , former smoker,    breath sounds clear to auscultation       Cardiovascular hypertension,  Rhythm:Regular     Neuro/Psych    GI/Hepatic   Endo/Other  Morbid obesity  Renal/GU      Musculoskeletal  (+) Arthritis ,   Abdominal   Peds  Hematology   Anesthesia Other Findings   Reproductive/Obstetrics                            Anesthesia Physical Anesthesia Plan  ASA: III  Anesthesia Plan: General   Post-op Pain Management:    Induction: Intravenous  PONV Risk Score and Plan: 3 and Ondansetron and Dexamethasone  Airway Management Planned: LMA and Oral ETT  Additional Equipment: None  Intra-op Plan:   Post-operative Plan: Extubation in OR  Informed Consent: I have reviewed the patients History and Physical, chart, labs and discussed the procedure including the risks, benefits and alternatives for the proposed anesthesia with the patient or authorized representative who has indicated his/her understanding and acceptance.     Dental advisory given  Plan Discussed with: CRNA and Surgeon  Anesthesia Plan Comments:         Anesthesia Quick Evaluation

## 2018-08-23 NOTE — H&P (Signed)
Brandi Simmons is a 53 year-old female patient who was referred by Dr. Paulina Fusiouglas E. Schultz, MD who is here for renal calculi.  The problem is on the left side. Her symptoms include back pain. Patient denies having flank pain, groin pain, nausea, vomiting, fever, chills, and blood in urine. She first began experiencing symptoms approximately 04/03/2018. This is not her first kidney stone. She has had 1 stones prior to getting this one. She is currently having back pain. She denies having flank pain, groin pain, nausea, vomiting, fever, and chills. She has not caught a stone in her urine strainer since her symptoms began.   She has had ESWL for treatment of her stones in the past.   She had been experiencing left lower quadrant pain for 2-3 months that was intermittent in nature. He CT scan was performed on 08/14/18 which revealed a 1.6 cm stone in the left renal pelvis (Hounsfield units of approximately 450).   The patient states that her pain has been intermittent. It will sometimes last 30 minutes. His been present for several months. She denies any associated nausea and vomiting. She denies any fevers or chills. She denies any dysuria or gross hematuria. She had shockwave lithotripsy 18 years ago she had a stent preop she has not had anything since then.     ALLERGIES: None   MEDICATIONS: Benazepril Hcl 20 mg tablet  Naproxen  Phentermine Hcl  Vitamin D     GU PSH: None   NON-GU PSH: Appendectomy    GU PMH: None   NON-GU PMH: Arthritis GERD Hypertension Sleep Apnea    FAMILY HISTORY: 2 daughters - Daughter Diabetes - Mother Heart Attack - Father   SOCIAL HISTORY: Marital Status: Married Preferred Language: English; Ethnicity: Not Hispanic Or Latino; Race: White Current Smoking Status: Patient does not smoke anymore. Has not smoked since 08/04/1998. Smoked for 23 years.   Tobacco Use Assessment Completed: Used Tobacco in last 30 days? Has never drank.  Drinks 2 caffeinated drinks  per day. 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    REVIEW OF SYSTEMS:    GU Review Female:   Patient reports get up at night to urinate and leakage of urine. Patient denies frequent urination, hard to postpone urination, burning /pain with urination, stream starts and stops, trouble starting your stream, have to strain to urinate, and being pregnant.  Gastrointestinal (Upper):   Patient reports indigestion/ heartburn. Patient denies nausea and vomiting.  Gastrointestinal (Lower):   Patient reports diarrhea. Patient denies constipation.  Constitutional:   Patient denies fever, night sweats, weight loss, and fatigue.  Skin:   Patient denies skin rash/ lesion and itching.  Eyes:   Patient denies blurred vision and double vision.  Ears/ Nose/ Throat:   Patient denies sore throat and sinus problems.  Hematologic/Lymphatic:   Patient denies swollen glands and easy bruising.  Cardiovascular:   Patient denies leg swelling and chest pains.  Respiratory:   Patient denies cough and shortness of breath.  Endocrine:   Patient denies excessive thirst.  Musculoskeletal:   Patient reports joint pain. Patient denies back pain.  Neurological:   Patient denies headaches and dizziness.  Psychologic:   Patient denies depression and anxiety.   VITAL SIGNS:      08/21/2018 10:56 AM  Weight 260 lb / 117.93 kg  Height 65 in / 165.1 cm  BP 139/78 mmHg  Pulse 58 /min  Temperature 98.4 F / 36.8 C  BMI 43.3 kg/m   GU PHYSICAL EXAMINATION:  Notes: Left CVA tenderness   MULTI-SYSTEM PHYSICAL EXAMINATION:    Constitutional: Well-nourished. No physical deformities. Normally developed. Good grooming.  Neck: Neck symmetrical, not swollen. Normal tracheal position.  Respiratory: Normal breath sounds. No labored breathing, no use of accessory muscles.   Cardiovascular: Regular rate and rhythm. No murmur, no gallop. Normal temperature, normal extremity pulses, no swelling, no varicosities.   Skin: No paleness,  no jaundice, no cyanosis. No lesion, no ulcer, no rash.  Neurologic / Psychiatric: Oriented to time, oriented to place, oriented to person. No depression, no anxiety, no agitation.  Gastrointestinal: No mass, no tenderness, no rigidity, non obese abdomen.  Eyes: Normal conjunctivae. Normal eyelids.  Ears, Nose, Mouth, and Throat: Left ear no scars, no lesions, no masses. Right ear no scars, no lesions, no masses. Nose no scars, no lesions, no masses. Normal hearing. Normal lips.  Musculoskeletal: Normal gait and station of head and neck.     PAST DATA REVIEWED:  Source Of History:  Patient, Outside Source  Lab Test Review:   Calcium, WBC  Records Review:   Previous Doctor Records, Previous Hospital Records  X-Ray Review: C.T. Abdomen/Pelvis: Reviewed Films. Reviewed Report. Discussed With Patient.    Notes:                     A creatinine on 08/14/18 was normal at 0.72 with normal serum calcium of 9.4 and white blood cell count of 8.3.   PROCEDURES:         KUB - [id number]  A single view of the abdomen is obtained. Renal shadows are easily visualized bilaterally. There are no stones appreciated within the expected location in either renal pelvis. There are no additional calcifications along the expected location of either ureter bilaterally.  Gas pattern is grossly normal. No significant bony abnormalities.      Patient confirmed No Neulasta OnPro Device.  Impression: The patient's known stone in the left renal pelvis is not present today or at least not visible suggesting that she has a uric acid stone.           Urinalysis w/Scope - 81001 Dipstick Dipstick Cont'd Micro  Color: Yellow Bilirubin: Neg WBC/hpf: 0 - 5/hpf  Appearance: Cloudy Ketones: Neg RBC/hpf: 10 - 20/hpf  Specific Gravity: 1.025 Blood: 3+ Bacteria: Few (10-25/hpf)  pH: 5.5 Protein: Trace Cystals: NS (Not Seen)  Glucose: Neg Urobilinogen: 0.2 Casts: NS (Not Seen)    Nitrites: Neg Trichomonas: Not Present     Leukocyte Esterase: 1+ Mucous: Present      Epithelial Cells: 6 - 10/hpf      Yeast: NS (Not Seen)      Sperm: Not Present    Notes:      ASSESSMENT:      ICD-10 Details  1 GU:   Renal calculus - N20.0 Left          Notes:   We discussed the management of urinary stones. These options include observation, ureteroscopy, shockwave lithotripsy, and PCNL. We discussed which options are relevant to these particular stones. We discussed the natural history of stones as well as the complications of untreated stones and the impact on quality of life without treatment as well as with each of the above listed treatments. We also discussed the efficacy of each treatment in its ability to clear the stone burden. With any of these management options I discussed the signs and symptoms of infection and the need for emergent treatment should these be experienced. For  each option we discussed the ability of each procedure to clear the patient of their stone burden.   For observation I described the risks which include but are not limited to silent renal damage, life-threatening infection, need for emergent surgery, failure to pass stone, and pain.   For ureteroscopy I described the risks which include heart attack, stroke, pulmonary embolus, death, bleeding, infection, damage to contiguous structures, positioning injury, ureteral stricture, ureteral avulsion, ureteral injury, need for ureteral stent, inability to perform ureteroscopy, need for an interval procedure, inability to clear stone burden, stent discomfort and pain.   For shockwave lithotripsy I described the risks which include arrhythmia, kidney contusion, kidney hemorrhage, need for transfusion, long-term risk of diabetes or hypertension, back discomfort, flank ecchymosis, flank abrasion, inability to break up stone, inability to pass stone fragments, Steinstrasse, infection associated with obstructing stones, need for different surgical procedure,  need for repeat shockwave lithotripsy, and death.   For PCNL I described the risks including heart attack, sure, pulmonary embolus, death, positioning injury, pneumothorax, hydrothorax, need for chest tube, inability to clear stone burden, renal laceration, arterial venous fistula or malformation, need for embolization of kidney, loss of kidney or renal function, need for repeat procedure, need for prolonged nephrostomy tube, ureteral avulsion, fistula.    PLAN:            Medications New Meds: Ultram 50 mg tablet 1-2 tablet PO Q 6 H   #20  0 Refill(s)            Schedule Labs: Today 08/28/2018 - Urine Culture          Document Letter(s):  Created for Patient: Clinical Summary         Notes:   Our plan moving forward is to have the patient present for elective ureteroscopy as soon as we can get her on the schedule.

## 2018-08-23 NOTE — Anesthesia Procedure Notes (Signed)
Procedure Name: LMA Insertion Date/Time: 08/23/2018 9:04 AM Performed by: Val Eagle, MD Pre-anesthesia Checklist: Patient identified, Emergency Drugs available, Suction available and Patient being monitored Patient Re-evaluated:Patient Re-evaluated prior to induction Oxygen Delivery Method: Circle system utilized Preoxygenation: Pre-oxygenation with 100% oxygen Induction Type: IV induction Ventilation: Mask ventilation without difficulty LMA: LMA inserted LMA Size: 4.0 Number of attempts: 1 Airway Equipment and Method: Bite block Placement Confirmation: positive ETCO2 Tube secured with: Tape Dental Injury: Teeth and Oropharynx as per pre-operative assessment

## 2018-08-23 NOTE — Op Note (Signed)
Preoperative diagnosis: left renal pelvic calculus  Postoperative diagnosis: same  Procedure:  1. Cystoscopy 2. left ureteroscopy and stone removal 3. Ureteroscopic laser lithotripsy 4. left 31F x 24cm  ureteral stent placement  5. left retrograde pyelography with interpretation  Surgeon: Crist Fat, MD  Anesthesia: General  Complications: None  Intraoperative findings: left retrograde pyelography demonstrated a filling defect within the left renal pelvis consistent with the patient's known calculus without other abnormalities.  EBL: Minimal  Specimens: 1. left ureteral calculus  Disposition of specimens: [facility] for stone analysis  Indication: Brandi Simmons is a 53 y.o.   patient with a left renal pelvic stone and intermittent obstructiong with  associated left symptoms. After reviewing the management options for treatment, the patient elected to proceed with the above surgical procedure(s). We have discussed the potential benefits and risks of the procedure, side effects of the proposed treatment, the likelihood of the patient achieving the goals of the procedure, and any potential problems that might occur during the procedure or recuperation. Informed consent has been obtained.   Description of procedure:  The patient was taken to the operating room and general anesthesia was induced.  The patient was placed in the dorsal lithotomy position, prepped and draped in the usual sterile fashion, and preoperative antibiotics were administered. A preoperative time-out was performed.   Cystourethroscopy was performed.  The patient's urethra was examined and was normal. . The bladder was then systematically examined in its entirety. There was no evidence for any bladder tumors, stones, or other mucosal pathology.    Attention then turned to the left ureteral orifice and a ureteral catheter was used to intubate the ureteral orifice.  Omnipaque contrast was  injected through the ureteral catheter and a retrograde pyelogram was performed with findings as dictated above.  A 0.38 sensor guidewire was then advanced up the left ureter into the renal pelvis under fluoroscopic guidance. The 6 Fr semirigid ureteroscope was then advanced up to the renal pelvis.  I then backed out the ureteroscope after advancing a wire through it up into the left renal pelvis.  At this point I use the inner portion of the 12/14 [place] ureteral access sheath and advanced this up into the mid proximal ureter dilating the ureter as best I could.  I then remove that past the complete ureteral access sheath up to the proximal ureter removing the inner portion as well as the wire.  I then was able to easily access the renal pelvis with the flexible ureteroscope.  The stone was noted to be within the renal pelvis.   The stone was then fragmented with the 200 micron holmium laser fiber on a setting of 1.0 and frequency of 10Hz .   All stones were then removed from the kidney with an N-gage nitinol basket.  Reinspection of the kidney revealed no remaining visible stones or fragments.  I then slowly backed out the flexible ureteroscope removing the access sheath simultaneously noting no significant ureteral trauma.  The wire was then backloaded through the cystoscope and a ureteral stent was advance over the wire using Seldinger technique.  The stent was positioned appropriately under fluoroscopic and cystoscopic guidance.  The wire was then removed with an adequate stent curl noted in the renal pelvis as well as in the bladder.  The bladder was then emptied and the procedure ended.  The patient appeared to tolerate the procedure well and without complications.  The patient was able to be awakened and transferred to  the recovery unit in satisfactory condition.   Disposition: The tether of the stent was left on and tucked inside the patient's vagina.  Instructions for removing the stent have  been provided to the patient. The patient has been scheduled for followup in 6 weeks with a renal ultrasound.

## 2018-08-23 NOTE — Discharge Instructions (Signed)
DISCHARGE INSTRUCTIONS FOR KIDNEY STONE/URETERAL STENT   MEDICATIONS:  1.  Resume all your other meds from home - except do not take any extra narcotic pain meds that you may have at home.  2. Pyridium is to help with the burning/stinging when you urinate. 3. Tramadol is for moderate/severe pain, otherwise taking upto 1000 mg every 6 hours of plainTylenol will help treat your pain. 4. Take Cipro one hour prior to removal of your stent.   ACTIVITY:  1. No strenuous activity x 1week  2. No driving while on narcotic pain medications  3. Drink plenty of water  4. Continue to walk at home - you can still get blood clots when you are at home, so keep active, but don't over do it.  5. May return to work/school tomorrow or when you feel ready   BATHING:  1. You can shower and we recommend daily showers  2. You have a string coming from your urethra: The stent string is attached to your ureteral stent. Do not pull on this.   SIGNS/SYMPTOMS TO CALL:  Please call us if you have a fever greater than 101.5, uncontrolled nausea/vomiting, uncontrolled pain, dizziness, unable to urinate, bloody urine, chest pain, shortness of breath, leg swelling, leg pain, redness around wound, drainage from wound, or any other concerns or questions.   You can reach [place] at [phone].   FOLLOW-UP:  1. You have an appointment in 6 weeks with a ultrasound of your kidneys prior.   2. You have a string attached to your stent, you may remove it on Wednesday, Feb 26th. To do this, pull the strings until the stents are completely removed. You may feel an odd sensation in your back.    Post Anesthesia Home Care Instructions  Activity: Get plenty of rest for the remainder of the day. A responsible individual must stay with you for 24 hours following the procedure.  For the next 24 hours, DO NOT: -Drive a car -Advertising copywriter -Drink alcoholic beverages -Take any medication unless instructed by your physician -Make  any legal decisions or sign important papers.  Meals: Start with liquid foods such as gelatin or soup. Progress to regular foods as tolerated. Avoid greasy, spicy, heavy foods. If nausea and/or vomiting occur, drink only clear liquids until the nausea and/or vomiting subsides. Call your physician if vomiting continues.  Special Instructions/Symptoms: Your throat may feel dry or sore from the anesthesia or the breathing tube placed in your throat during surgery. If this causes discomfort, gargle with warm salt water. The discomfort should disappear within 24 hours.  If you had a scopolamine patch placed behind your ear for the management of post- operative nausea and/or vomiting:  1. The medication in the patch is effective for 72 hours, after which it should be removed.  Wrap patch in a tissue and discard in the trash. Wash hands thoroughly with soap and water. 2. You may remove the patch earlier than 72 hours if you experience unpleasant side effects which may include dry mouth, dizziness or visual disturbances. 3. Avoid touching the patch. Wash your hands with soap and water after contact with the patch.    Ibuprofen may be taken after 3:45 if needed.

## 2018-08-24 ENCOUNTER — Encounter (HOSPITAL_BASED_OUTPATIENT_CLINIC_OR_DEPARTMENT_OTHER): Payer: Self-pay | Admitting: Urology

## 2018-08-24 DIAGNOSIS — N200 Calculus of kidney: Secondary | ICD-10-CM | POA: Diagnosis not present

## 2018-08-28 NOTE — Anesthesia Postprocedure Evaluation (Signed)
Anesthesia Post Note  Patient: Brandi Simmons  Procedure(s) Performed: LEFT URETEROSCOPY/HOLMIUM LASER/STENT PLACEMENT (Left Ureter)     Patient location during evaluation: PACU Anesthesia Type: General Level of consciousness: awake and alert Pain management: pain level controlled Vital Signs Assessment: post-procedure vital signs reviewed and stable Respiratory status: spontaneous breathing, nonlabored ventilation, respiratory function stable and patient connected to nasal cannula oxygen Cardiovascular status: blood pressure returned to baseline and stable Postop Assessment: no apparent nausea or vomiting Anesthetic complications: no    Last Vitals:  Vitals:   08/23/18 1108 08/23/18 1215  BP: (!) 155/75 (!) 156/88  Pulse: 75 65  Resp: 17 16  Temp:  36.5 C  SpO2: 94% 95%    Last Pain:  Vitals:   08/23/18 1215  TempSrc:   PainSc: 7                  Merary Garguilo

## 2018-10-09 DIAGNOSIS — N200 Calculus of kidney: Secondary | ICD-10-CM | POA: Diagnosis not present

## 2018-10-23 DIAGNOSIS — G4733 Obstructive sleep apnea (adult) (pediatric): Secondary | ICD-10-CM | POA: Diagnosis not present

## 2018-12-17 DIAGNOSIS — R809 Proteinuria, unspecified: Secondary | ICD-10-CM | POA: Diagnosis not present

## 2018-12-17 DIAGNOSIS — R7301 Impaired fasting glucose: Secondary | ICD-10-CM | POA: Diagnosis not present

## 2018-12-17 DIAGNOSIS — E559 Vitamin D deficiency, unspecified: Secondary | ICD-10-CM | POA: Diagnosis not present

## 2018-12-17 DIAGNOSIS — I10 Essential (primary) hypertension: Secondary | ICD-10-CM | POA: Diagnosis not present

## 2018-12-17 DIAGNOSIS — E785 Hyperlipidemia, unspecified: Secondary | ICD-10-CM | POA: Diagnosis not present

## 2018-12-17 DIAGNOSIS — Z20828 Contact with and (suspected) exposure to other viral communicable diseases: Secondary | ICD-10-CM | POA: Diagnosis not present

## 2018-12-21 DIAGNOSIS — M25561 Pain in right knee: Secondary | ICD-10-CM | POA: Diagnosis not present

## 2018-12-25 DIAGNOSIS — M25561 Pain in right knee: Secondary | ICD-10-CM | POA: Diagnosis not present

## 2018-12-28 DIAGNOSIS — M25561 Pain in right knee: Secondary | ICD-10-CM | POA: Diagnosis not present

## 2019-01-01 DIAGNOSIS — M25561 Pain in right knee: Secondary | ICD-10-CM | POA: Diagnosis not present

## 2019-01-03 DIAGNOSIS — M25561 Pain in right knee: Secondary | ICD-10-CM | POA: Diagnosis not present

## 2019-01-07 DIAGNOSIS — G4733 Obstructive sleep apnea (adult) (pediatric): Secondary | ICD-10-CM | POA: Diagnosis not present

## 2019-01-08 DIAGNOSIS — M25561 Pain in right knee: Secondary | ICD-10-CM | POA: Diagnosis not present

## 2019-01-11 DIAGNOSIS — N200 Calculus of kidney: Secondary | ICD-10-CM | POA: Diagnosis not present

## 2019-01-11 DIAGNOSIS — M25561 Pain in right knee: Secondary | ICD-10-CM | POA: Diagnosis not present

## 2019-01-15 DIAGNOSIS — M25561 Pain in right knee: Secondary | ICD-10-CM | POA: Diagnosis not present

## 2019-01-21 DIAGNOSIS — G4733 Obstructive sleep apnea (adult) (pediatric): Secondary | ICD-10-CM | POA: Diagnosis not present

## 2019-04-11 DIAGNOSIS — R208 Other disturbances of skin sensation: Secondary | ICD-10-CM | POA: Diagnosis not present

## 2019-04-22 DIAGNOSIS — G4733 Obstructive sleep apnea (adult) (pediatric): Secondary | ICD-10-CM | POA: Diagnosis not present

## 2019-06-17 DIAGNOSIS — U071 COVID-19: Secondary | ICD-10-CM | POA: Diagnosis not present

## 2019-06-17 DIAGNOSIS — I10 Essential (primary) hypertension: Secondary | ICD-10-CM | POA: Diagnosis not present

## 2019-06-17 DIAGNOSIS — R809 Proteinuria, unspecified: Secondary | ICD-10-CM | POA: Diagnosis not present

## 2019-06-17 DIAGNOSIS — R7301 Impaired fasting glucose: Secondary | ICD-10-CM | POA: Diagnosis not present

## 2019-06-17 DIAGNOSIS — E785 Hyperlipidemia, unspecified: Secondary | ICD-10-CM | POA: Diagnosis not present

## 2019-06-17 DIAGNOSIS — Z1331 Encounter for screening for depression: Secondary | ICD-10-CM | POA: Diagnosis not present

## 2019-06-17 DIAGNOSIS — E559 Vitamin D deficiency, unspecified: Secondary | ICD-10-CM | POA: Diagnosis not present

## 2019-06-24 DIAGNOSIS — N200 Calculus of kidney: Secondary | ICD-10-CM | POA: Diagnosis not present

## 2019-07-22 DIAGNOSIS — G4733 Obstructive sleep apnea (adult) (pediatric): Secondary | ICD-10-CM | POA: Diagnosis not present

## 2019-08-26 DIAGNOSIS — G4733 Obstructive sleep apnea (adult) (pediatric): Secondary | ICD-10-CM | POA: Diagnosis not present

## 2019-10-31 IMAGING — CT CT ABD-PELV W/O
2 of 4 series · 16 of 46 positions shown, 18 images · non-contrast
Comparison: 03/02/2011

CLINICAL DATA: Left-sided abdominal pain, left flank pain, history
of renal stones and stent

EXAM:
CT ABDOMEN AND PELVIS WITHOUT CONTRAST
TECHNIQUE: Multidetector CT imaging of the abdomen and pelvis was performed
following the standard protocol without IV contrast.

[Series 2: axial st · axial · 0.98mm/px · z∈[-506,-32]mm · 13 of 103 slices shown, 15 images]
[im 4/103  soft-tissue]
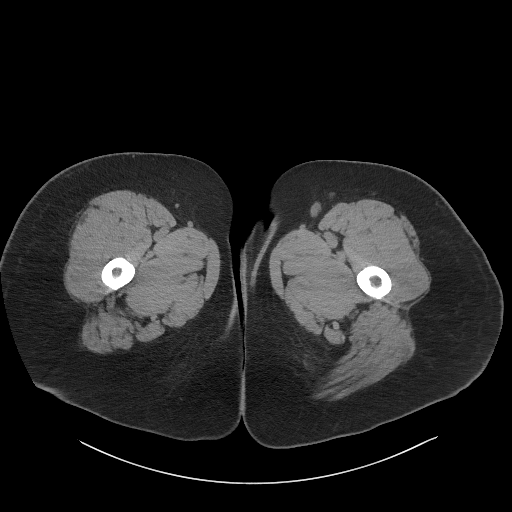
[im 4/103  bone]
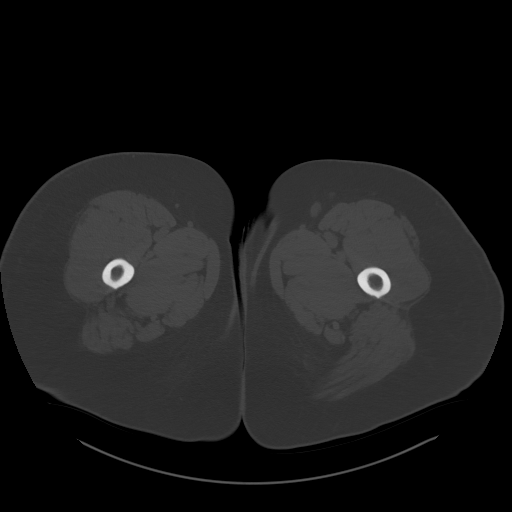
[im 12/103  soft-tissue]
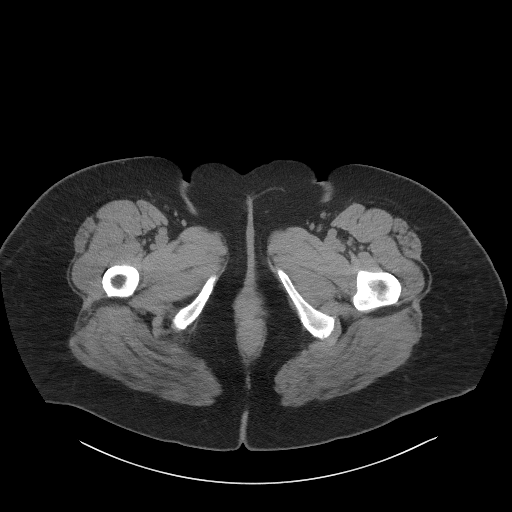
[im 20/103  soft-tissue]
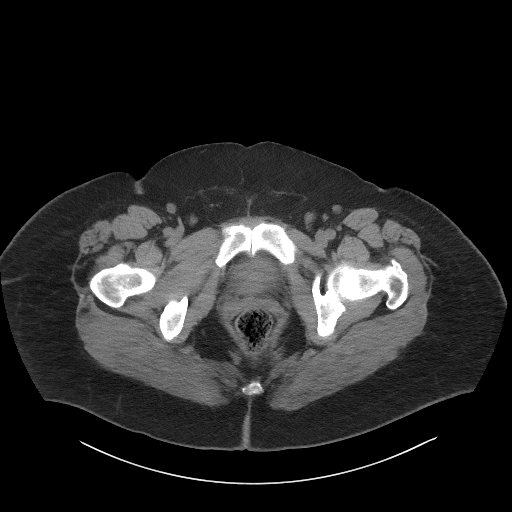
[im 28/103  soft-tissue]
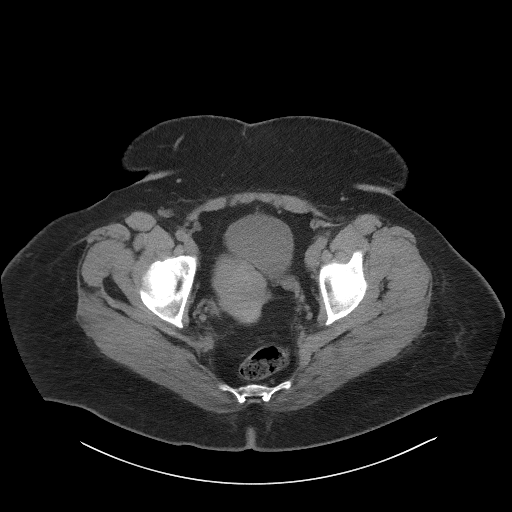
[im 36/103  soft-tissue]
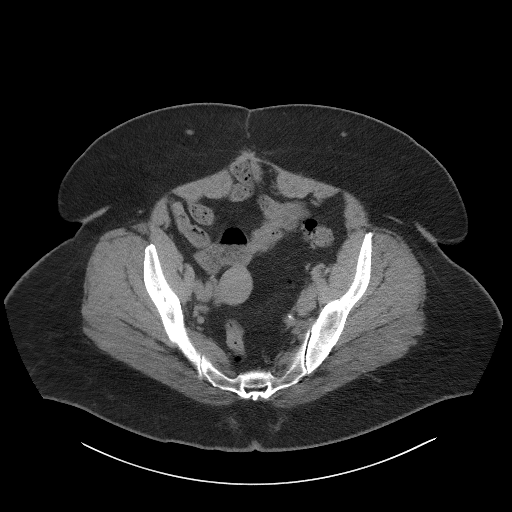
[im 44/103  soft-tissue]
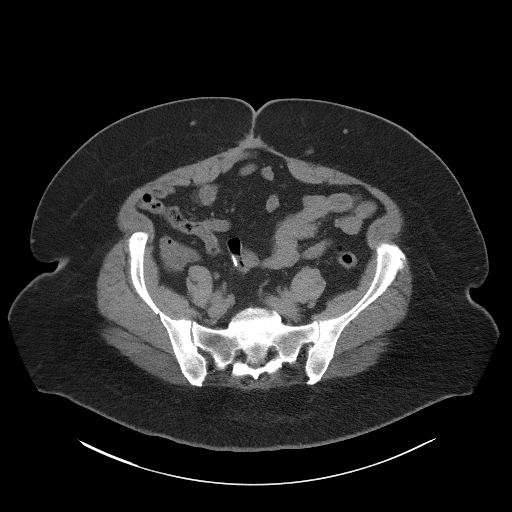
[im 52/103  soft-tissue]
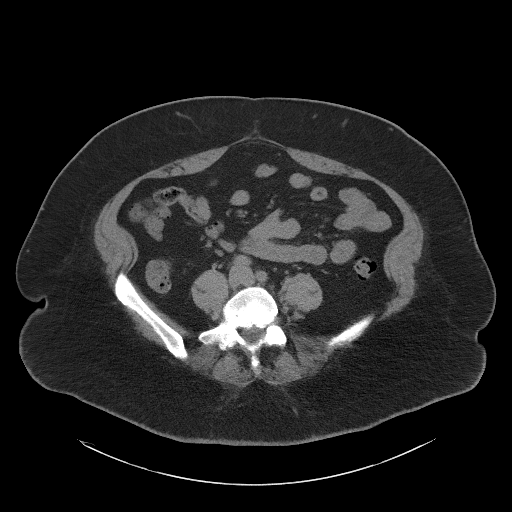
[im 59/103  soft-tissue]
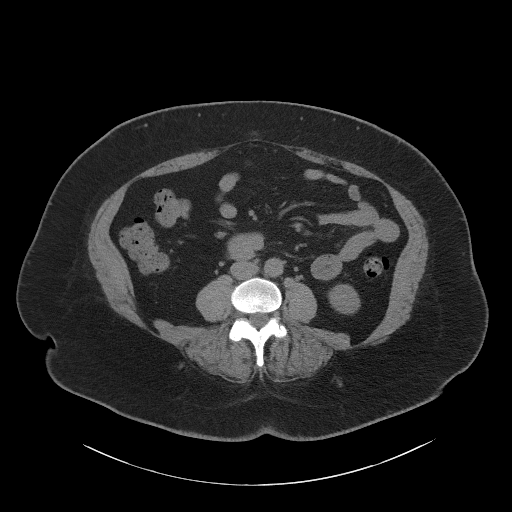
[im 67/103  soft-tissue]
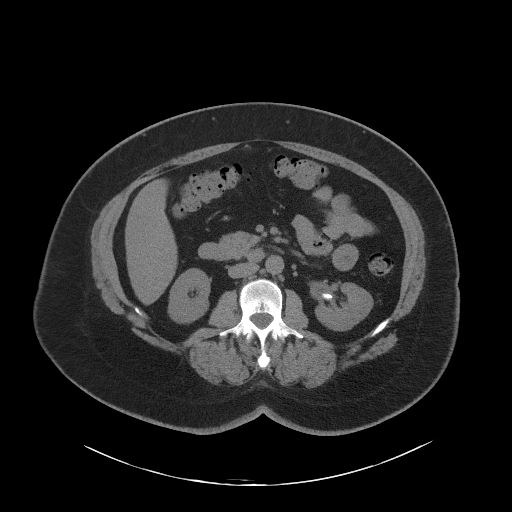
[im 67/103  bone]
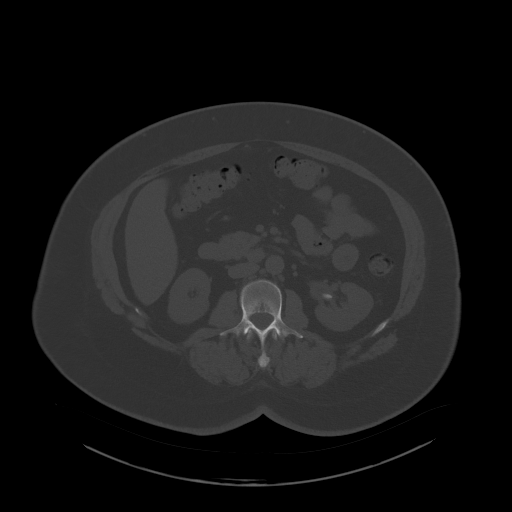
[im 75/103  soft-tissue]
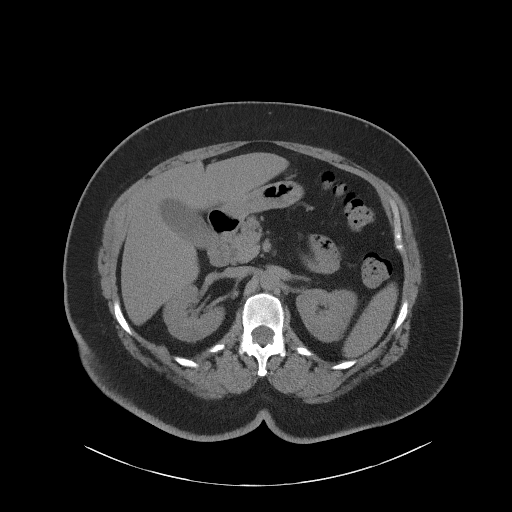
[im 83/103  soft-tissue]
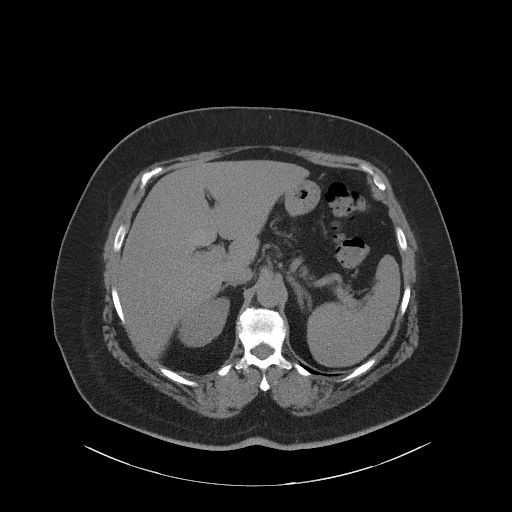
[im 91/103  soft-tissue]
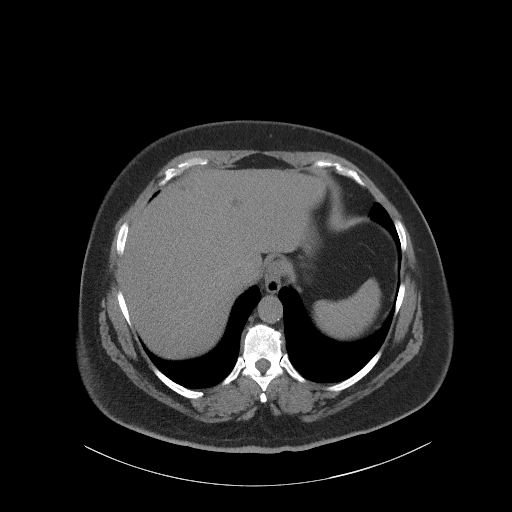
[im 99/103  soft-tissue]
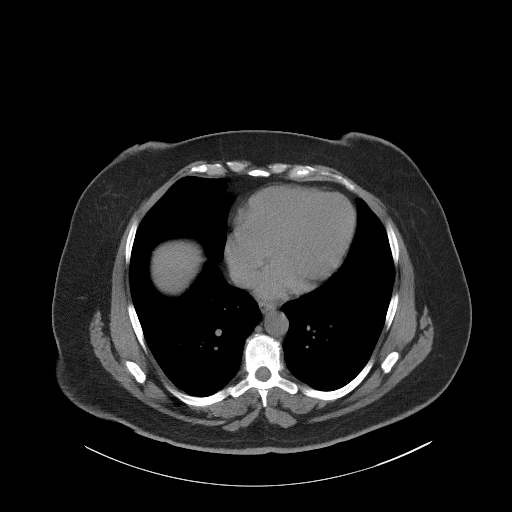

[Series 5: coronal st · coronal · 0.77mm/px · 3 of 119 slices shown]
[im 40/119  soft-tissue]
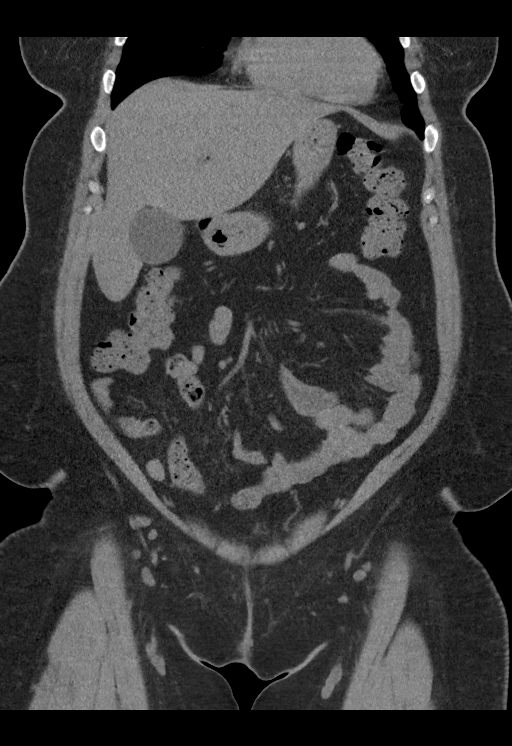
[im 53/119  soft-tissue]
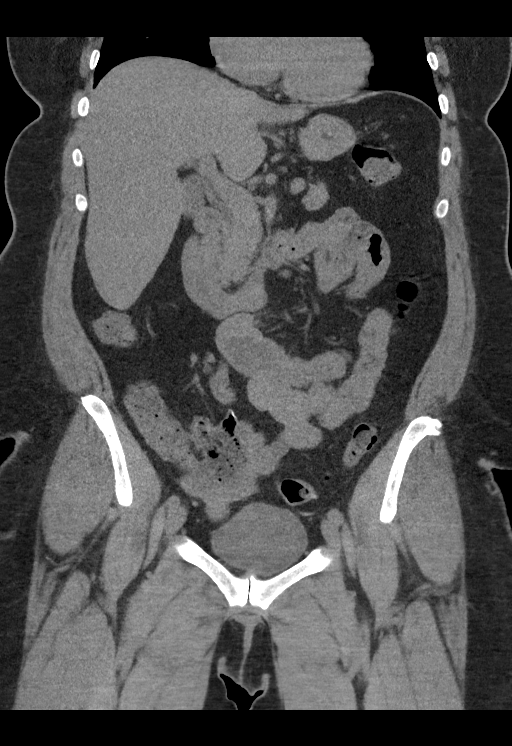
[im 66/119  soft-tissue]
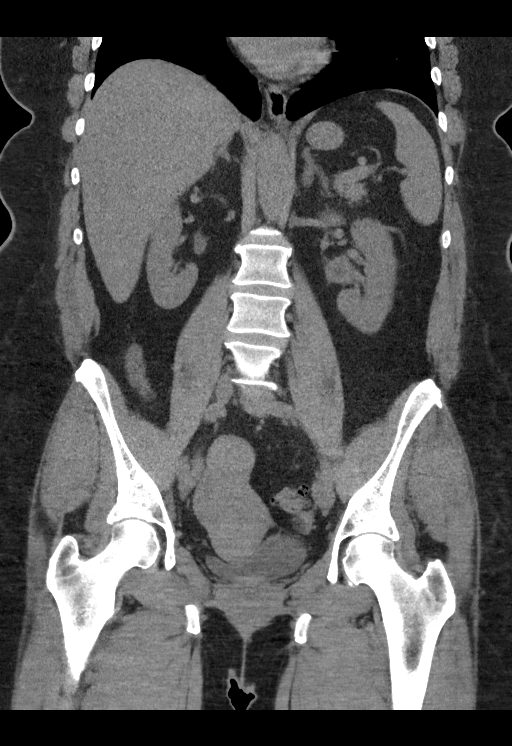

[16 of 46 positions shown; findings below may reference images not displayed]

FINDINGS: Lower chest: No acute abnormality.

Hepatobiliary: No focal liver abnormality is seen. No gallstones,
gallbladder wall thickening, or biliary dilatation.

Pancreas: Unremarkable. No pancreatic ductal dilatation or
surrounding inflammatory changes.

Spleen: Normal in size without focal abnormality.

Adrenals/Urinary Tract: Adrenal glands are unremarkable. There is a
16 mm calculus in the left renal pelvis. No evidence of
hydronephrosis or hydroureter and no right-sided calculus or
ureteral calculus. Bladder is unremarkable.

Stomach/Bowel: Stomach is within normal limits. Appendix not clearly
visualized and may be surgically absent. No evidence of bowel wall
thickening, distention, or inflammatory changes. Sigmoid
diverticulosis.

Vascular/Lymphatic: No significant vascular findings are present. No
enlarged abdominal or pelvic lymph nodes.

Reproductive: Uterine fibroids.

Other: No abdominal wall hernia or abnormality. No abdominopelvic
ascites.

Musculoskeletal: No acute or significant osseous findings.
IMPRESSION: 1. There is a 16 mm calculus in the left renal pelvis. Given
position, this may be mobile and intermittently obstructing. No
evidence of hydronephrosis or hydroureter and no right-sided
calculus or ureteral calculus.

2.  Other chronic and incidental findings as detailed above.

## 2019-11-26 DIAGNOSIS — G4733 Obstructive sleep apnea (adult) (pediatric): Secondary | ICD-10-CM | POA: Diagnosis not present

## 2019-11-28 DIAGNOSIS — G4733 Obstructive sleep apnea (adult) (pediatric): Secondary | ICD-10-CM | POA: Diagnosis not present

## 2019-12-16 DIAGNOSIS — R7301 Impaired fasting glucose: Secondary | ICD-10-CM | POA: Diagnosis not present

## 2019-12-16 DIAGNOSIS — E785 Hyperlipidemia, unspecified: Secondary | ICD-10-CM | POA: Diagnosis not present

## 2019-12-16 DIAGNOSIS — I10 Essential (primary) hypertension: Secondary | ICD-10-CM | POA: Diagnosis not present

## 2019-12-16 DIAGNOSIS — E559 Vitamin D deficiency, unspecified: Secondary | ICD-10-CM | POA: Diagnosis not present

## 2019-12-16 DIAGNOSIS — N951 Menopausal and female climacteric states: Secondary | ICD-10-CM | POA: Diagnosis not present

## 2019-12-16 DIAGNOSIS — R809 Proteinuria, unspecified: Secondary | ICD-10-CM | POA: Diagnosis not present

## 2019-12-23 DIAGNOSIS — N200 Calculus of kidney: Secondary | ICD-10-CM | POA: Diagnosis not present

## 2019-12-30 DIAGNOSIS — R8271 Bacteriuria: Secondary | ICD-10-CM | POA: Diagnosis not present

## 2020-01-06 ENCOUNTER — Ambulatory Visit (HOSPITAL_BASED_OUTPATIENT_CLINIC_OR_DEPARTMENT_OTHER)
Admission: RE | Admit: 2020-01-06 | Discharge: 2020-01-06 | Disposition: A | Discharge status: Home / Self Care | Payer: Federal, State, Local not specified - PPO | Source: Ambulatory Visit | Attending: Internal Medicine | Admitting: Internal Medicine

## 2020-01-06 ENCOUNTER — Other Ambulatory Visit: Payer: Self-pay

## 2020-01-06 ENCOUNTER — Other Ambulatory Visit (HOSPITAL_BASED_OUTPATIENT_CLINIC_OR_DEPARTMENT_OTHER): Payer: Self-pay | Admitting: Internal Medicine

## 2020-01-06 DIAGNOSIS — M25552 Pain in left hip: Secondary | ICD-10-CM | POA: Diagnosis not present

## 2020-01-06 DIAGNOSIS — M1612 Unilateral primary osteoarthritis, left hip: Secondary | ICD-10-CM | POA: Diagnosis not present

## 2020-01-08 DIAGNOSIS — G4733 Obstructive sleep apnea (adult) (pediatric): Secondary | ICD-10-CM | POA: Diagnosis not present

## 2020-01-09 DIAGNOSIS — M25561 Pain in right knee: Secondary | ICD-10-CM | POA: Diagnosis not present

## 2020-01-13 DIAGNOSIS — M25561 Pain in right knee: Secondary | ICD-10-CM | POA: Diagnosis not present

## 2020-01-17 DIAGNOSIS — M25561 Pain in right knee: Secondary | ICD-10-CM | POA: Diagnosis not present

## 2020-01-20 DIAGNOSIS — M25561 Pain in right knee: Secondary | ICD-10-CM | POA: Diagnosis not present

## 2020-01-21 DIAGNOSIS — M25561 Pain in right knee: Secondary | ICD-10-CM | POA: Diagnosis not present

## 2020-01-30 ENCOUNTER — Ambulatory Visit: Payer: Federal, State, Local not specified - PPO

## 2020-01-30 DIAGNOSIS — M25561 Pain in right knee: Secondary | ICD-10-CM | POA: Diagnosis not present

## 2020-02-27 DIAGNOSIS — G4733 Obstructive sleep apnea (adult) (pediatric): Secondary | ICD-10-CM | POA: Diagnosis not present

## 2020-03-16 DIAGNOSIS — K080 Exfoliation of teeth due to systemic causes: Secondary | ICD-10-CM | POA: Diagnosis not present

## 2020-05-18 DIAGNOSIS — Z1231 Encounter for screening mammogram for malignant neoplasm of breast: Secondary | ICD-10-CM | POA: Diagnosis not present

## 2020-05-18 DIAGNOSIS — Z01419 Encounter for gynecological examination (general) (routine) without abnormal findings: Secondary | ICD-10-CM | POA: Diagnosis not present

## 2020-05-18 DIAGNOSIS — Z6841 Body Mass Index (BMI) 40.0 and over, adult: Secondary | ICD-10-CM | POA: Diagnosis not present

## 2020-05-27 DIAGNOSIS — G4733 Obstructive sleep apnea (adult) (pediatric): Secondary | ICD-10-CM | POA: Diagnosis not present

## 2020-06-17 DIAGNOSIS — I10 Essential (primary) hypertension: Secondary | ICD-10-CM | POA: Diagnosis not present

## 2020-06-17 DIAGNOSIS — E559 Vitamin D deficiency, unspecified: Secondary | ICD-10-CM | POA: Diagnosis not present

## 2020-06-17 DIAGNOSIS — U071 COVID-19: Secondary | ICD-10-CM | POA: Diagnosis not present

## 2020-06-17 DIAGNOSIS — R7301 Impaired fasting glucose: Secondary | ICD-10-CM | POA: Diagnosis not present

## 2020-06-17 DIAGNOSIS — R809 Proteinuria, unspecified: Secondary | ICD-10-CM | POA: Diagnosis not present

## 2020-06-17 DIAGNOSIS — E785 Hyperlipidemia, unspecified: Secondary | ICD-10-CM | POA: Diagnosis not present

## 2020-06-23 DIAGNOSIS — N200 Calculus of kidney: Secondary | ICD-10-CM | POA: Diagnosis not present

## 2020-08-25 DIAGNOSIS — G4733 Obstructive sleep apnea (adult) (pediatric): Secondary | ICD-10-CM | POA: Diagnosis not present

## 2020-09-21 DIAGNOSIS — I10 Essential (primary) hypertension: Secondary | ICD-10-CM | POA: Diagnosis not present

## 2020-09-21 DIAGNOSIS — Z1331 Encounter for screening for depression: Secondary | ICD-10-CM | POA: Diagnosis not present

## 2020-09-21 DIAGNOSIS — U071 COVID-19: Secondary | ICD-10-CM | POA: Diagnosis not present

## 2020-09-21 DIAGNOSIS — E1169 Type 2 diabetes mellitus with other specified complication: Secondary | ICD-10-CM | POA: Diagnosis not present

## 2020-09-21 DIAGNOSIS — E785 Hyperlipidemia, unspecified: Secondary | ICD-10-CM | POA: Diagnosis not present

## 2020-10-05 DIAGNOSIS — E119 Type 2 diabetes mellitus without complications: Secondary | ICD-10-CM | POA: Diagnosis not present

## 2020-10-19 DIAGNOSIS — S76012A Strain of muscle, fascia and tendon of left hip, initial encounter: Secondary | ICD-10-CM | POA: Diagnosis not present

## 2020-10-19 DIAGNOSIS — M1612 Unilateral primary osteoarthritis, left hip: Secondary | ICD-10-CM | POA: Diagnosis not present

## 2020-11-23 DIAGNOSIS — G4733 Obstructive sleep apnea (adult) (pediatric): Secondary | ICD-10-CM | POA: Diagnosis not present

## 2020-12-18 DIAGNOSIS — E119 Type 2 diabetes mellitus without complications: Secondary | ICD-10-CM | POA: Diagnosis not present

## 2020-12-28 DIAGNOSIS — E785 Hyperlipidemia, unspecified: Secondary | ICD-10-CM | POA: Diagnosis not present

## 2020-12-28 DIAGNOSIS — E559 Vitamin D deficiency, unspecified: Secondary | ICD-10-CM | POA: Diagnosis not present

## 2020-12-28 DIAGNOSIS — E1169 Type 2 diabetes mellitus with other specified complication: Secondary | ICD-10-CM | POA: Diagnosis not present

## 2020-12-28 DIAGNOSIS — I10 Essential (primary) hypertension: Secondary | ICD-10-CM | POA: Diagnosis not present

## 2021-01-14 DIAGNOSIS — G4733 Obstructive sleep apnea (adult) (pediatric): Secondary | ICD-10-CM | POA: Diagnosis not present

## 2021-02-25 DIAGNOSIS — G4733 Obstructive sleep apnea (adult) (pediatric): Secondary | ICD-10-CM | POA: Diagnosis not present

## 2021-03-01 DIAGNOSIS — E119 Type 2 diabetes mellitus without complications: Secondary | ICD-10-CM | POA: Diagnosis not present

## 2021-03-24 IMAGING — DX DG HIP (WITH OR WITHOUT PELVIS) 2-3V*L*
3 series · 3 of 3 positions shown · non-contrast
Comparison: None.

CLINICAL DATA: Left hip pain several months.  No injury.

EXAM:
DG HIP (WITH OR WITHOUT PELVIS) 2-3V LEFT

[pelvis ap]
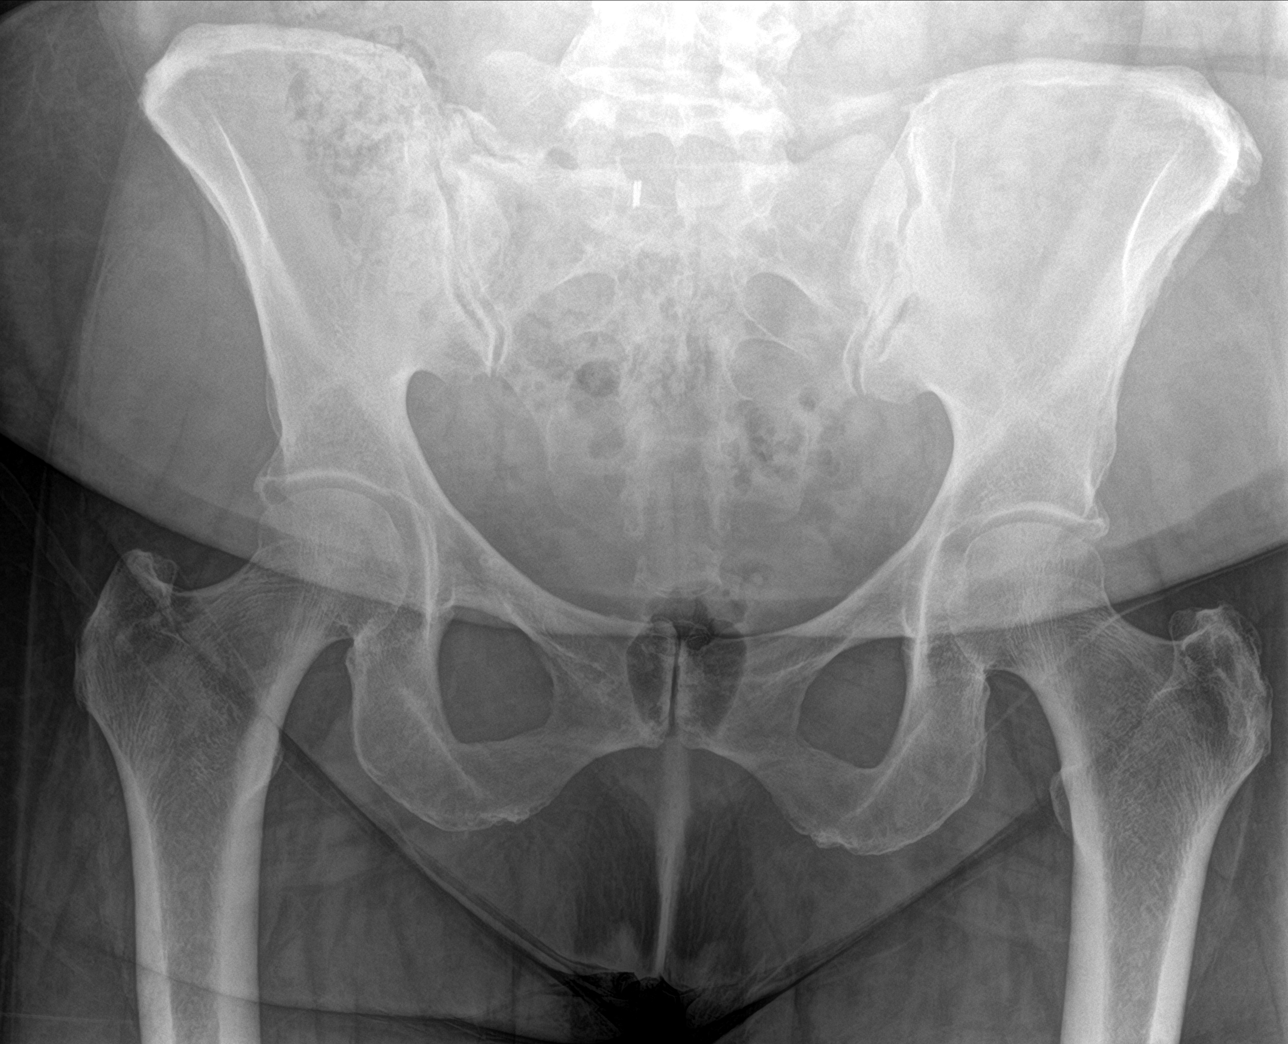

[hip ap]
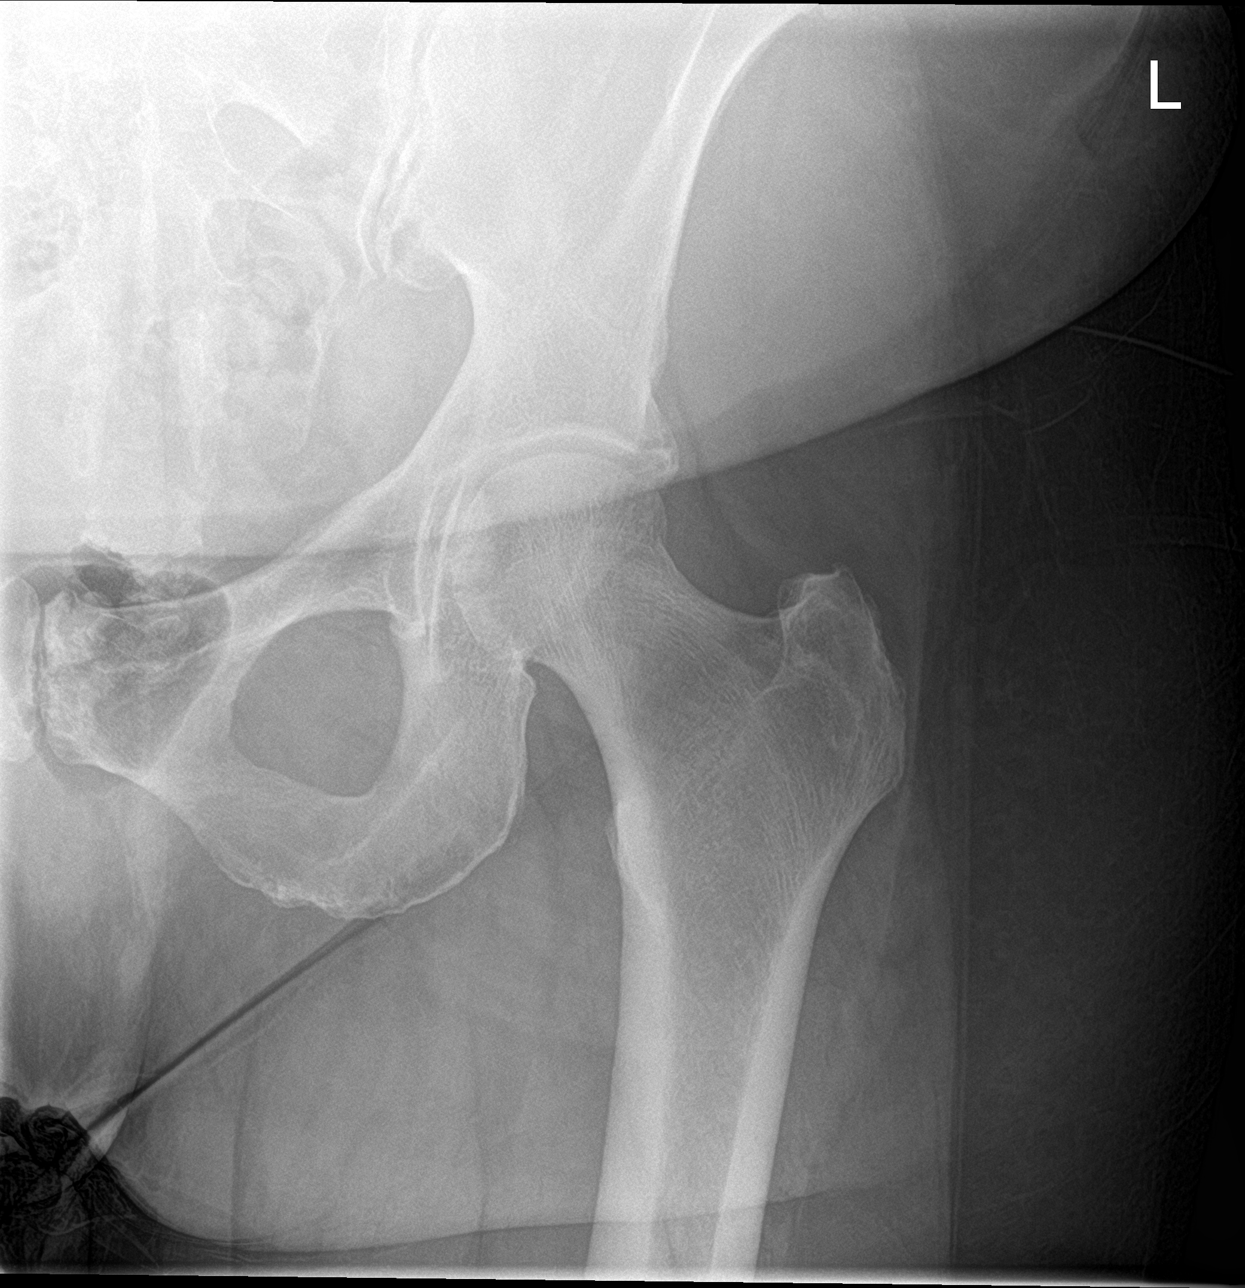

[hip lat]
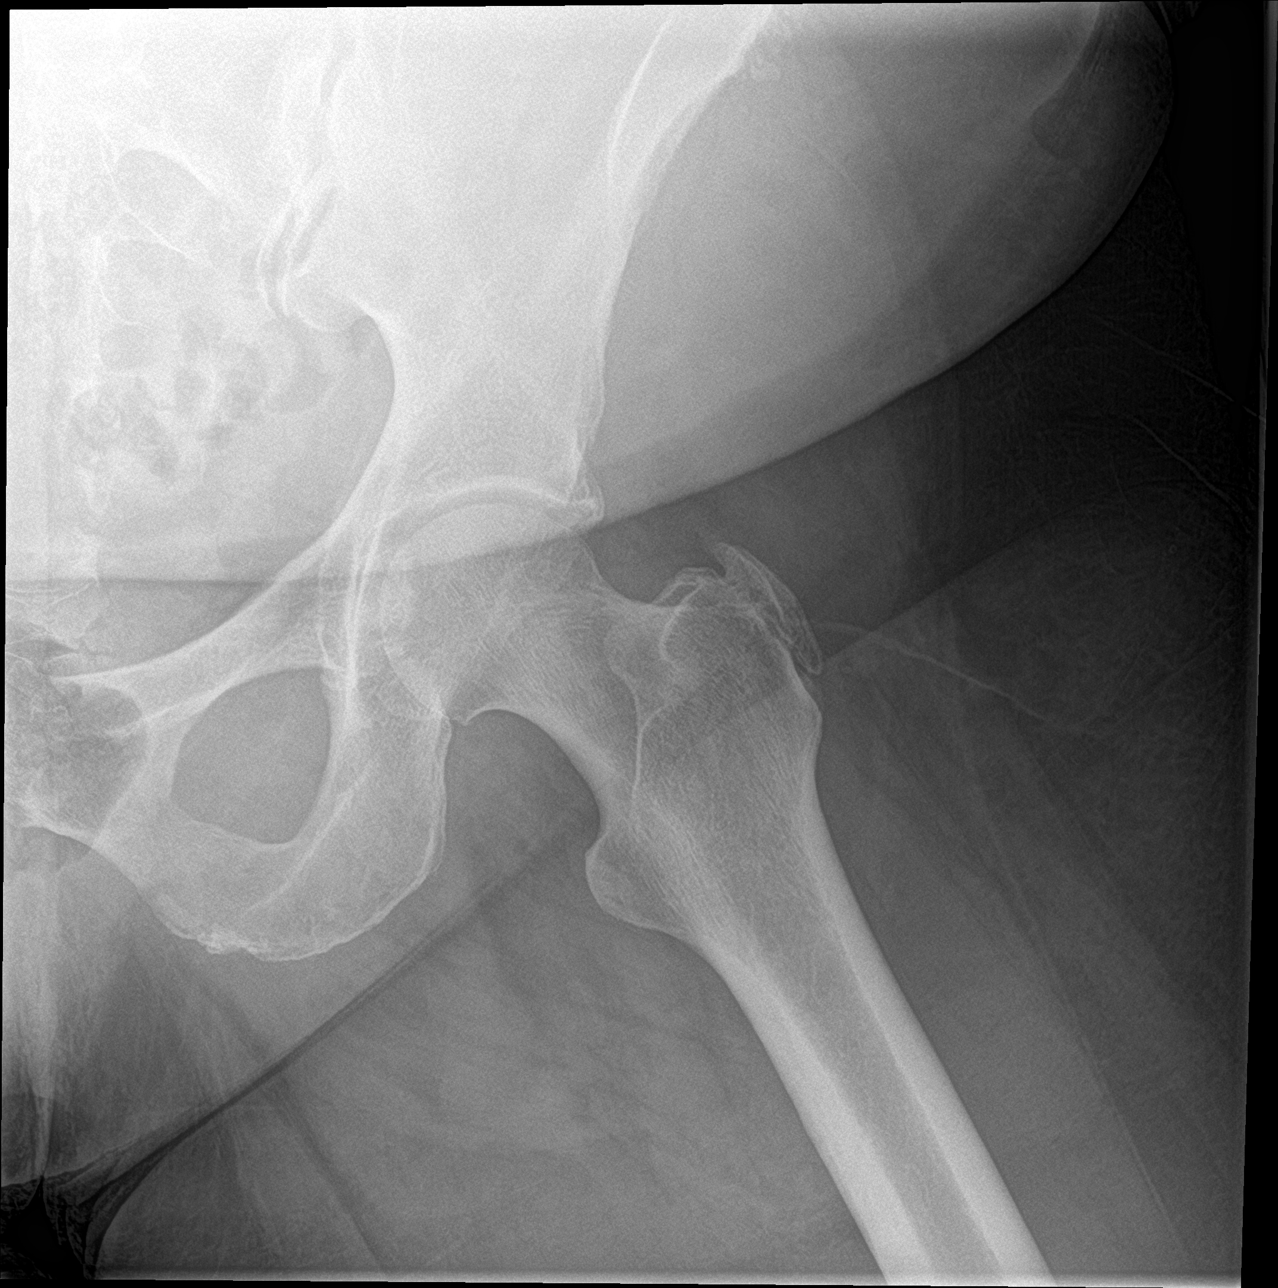

[3 of 3 positions shown; findings below may reference images not displayed]

FINDINGS: Minimal symmetric degenerative change of the hips. No acute fracture
or dislocation. No focal bony abnormality. Degenerative change of
the spine and sacroiliac joints. Pseudoarthrosis over the right
transverse process of L5 with the adjacent sacrum.
IMPRESSION: No acute findings.

## 2021-04-08 ENCOUNTER — Encounter: Payer: Self-pay | Admitting: Family Medicine

## 2021-04-08 ENCOUNTER — Ambulatory Visit: Payer: Federal, State, Local not specified - PPO | Admitting: Family Medicine

## 2021-04-08 ENCOUNTER — Encounter: Payer: Self-pay | Admitting: Gastroenterology

## 2021-04-08 ENCOUNTER — Ambulatory Visit (INDEPENDENT_AMBULATORY_CARE_PROVIDER_SITE_OTHER): Payer: Federal, State, Local not specified - PPO | Admitting: Gastroenterology

## 2021-04-08 ENCOUNTER — Ambulatory Visit: Payer: Self-pay

## 2021-04-08 VITALS — Ht 65.0 in | Wt 262.0 lb

## 2021-04-08 VITALS — BP 130/78 | HR 64 | Ht 65.0 in | Wt 263.0 lb

## 2021-04-08 DIAGNOSIS — M79644 Pain in right finger(s): Secondary | ICD-10-CM

## 2021-04-08 DIAGNOSIS — M1812 Unilateral primary osteoarthritis of first carpometacarpal joint, left hand: Secondary | ICD-10-CM | POA: Diagnosis not present

## 2021-04-08 DIAGNOSIS — Z1211 Encounter for screening for malignant neoplasm of colon: Secondary | ICD-10-CM

## 2021-04-08 DIAGNOSIS — S76311A Strain of muscle, fascia and tendon of the posterior muscle group at thigh level, right thigh, initial encounter: Secondary | ICD-10-CM

## 2021-04-08 DIAGNOSIS — Z800 Family history of malignant neoplasm of digestive organs: Secondary | ICD-10-CM

## 2021-04-08 DIAGNOSIS — M1811 Unilateral primary osteoarthritis of first carpometacarpal joint, right hand: Secondary | ICD-10-CM

## 2021-04-08 MED ORDER — DICLOFENAC SODIUM 2 % EX SOLN: 1.0000 "application " | Freq: Two times a day (BID) | CUTANEOUS | 2 refills | Status: DC

## 2021-04-08 NOTE — Assessment & Plan Note (Signed)
Having effusion on exam.  Does repetitive activity and pain is intermittent in nature. -Counseled on home exercise therapy and supportive care. -Brace today. -pennsaid

## 2021-04-08 NOTE — Patient Instructions (Addendum)
If you are age 57 or older, your body mass index should be between 23-30. Your Body mass index is 43.77 kg/m. If this is out of the aforementioned range listed, please consider follow up with your Primary Care Provider.  If you are age 26 or younger, your body mass index should be between 19-25. Your Body mass index is 43.77 kg/m. If this is out of the aformentioned range listed, please consider follow up with your Primary Care Provider.   __________________________________________________________  The [facility] providers would like to encourage you to use [facility] to communicate with providers for non-urgent requests or questions.  Due to long hold times on the telephone, sending your provider a message by [facility] may be a faster and more efficient way to get a response.  Please allow 48 business hours for a response.  Please remember that this is for non-urgent requests.   You have been scheduled for a colonoscopy. Please follow written instructions given to you at your visit today.  Please pick up your prep supplies at the pharmacy within the next 1-3 days. If you use inhalers (even only as needed), please bring them with you on the day of your procedure.  We have given you samples of the following medication to take: Clenpiq  Thank you,  Dr. Lynann Bologna

## 2021-04-08 NOTE — Progress Notes (Signed)
Brandi Simmons - 55 y.o. female MRN [id number]  Date of birth: 07-09-1965  SUBJECTIVE:  Including CC & ROS.  No chief complaint on file.   Brandi Simmons is a 55 y.o. female that is presenting with bilateral thumb pain and right hamstring pain.  The thumb pain occurs intermittently.  Has been using naproxen for the hamstring pain.    Review of Systems See HPI   HISTORY: Past Medical, Surgical, Social, and Family History Reviewed & Updated per EMR.   Pertinent Historical Findings include:  Past Medical History:  Diagnosis Date   Borderline diabetes mellitus    Family history of adverse reaction to anesthesia    daughter has PONV   Fatty liver    Genital warts    removed from cervix in her 20's   History of kidney stones    HTN (hypertension)    Hyperlipidemia    Hypertension    IBS (irritable bowel syndrome)    Osteoarthritis    Pre-diabetes    Sleep apnea    uses cpap   Urinary incontinence     Past Surgical History:  Procedure Laterality Date   APPENDECTOMY     COLONOSCOPY  11/28/2014   Moderate sigmoid diverticilosis. Small internal hemorrhoids. Otherwise normal colonoscopy.    CYSTOSCOPY/URETEROSCOPY/HOLMIUM LASER/STENT PLACEMENT Left 08/23/2018   Procedure: LEFT URETEROSCOPY/HOLMIUM LASER/STENT PLACEMENT;  Surgeon: Crist Fat, MD;  Location: [facility];  Service: Urology;  Laterality: Left;   EXTRACORPOREAL SHOCK WAVE LITHOTRIPSY     EYE SURGERY     as a child    Family History  Problem Relation Age of Onset   Cancer Mother 53       colon cancer   Diabetes Mother    Hypertension Mother    Cancer Father 44       colon cancer   Diabetes Father    Stroke Father    Heart failure Father    Other Sister        died from overdose age 44   Esophageal cancer Neg Hx    Breast cancer Neg Hx     Social History   Socioeconomic History   Marital status: Married    Spouse name: Not on file   Number of children: 2   Years of education:  Not on file   Highest education level: Not on file  Occupational History   Not on file  Tobacco Use   Smoking status: Former    Types: Cigarettes    Quit date: 07/04/1994    Years since quitting: 26.7   Smokeless tobacco: Never  Vaping Use   Vaping Use: Never used  Substance and Sexual Activity   Alcohol use: No   Drug use: No   Sexual activity: Yes    Partners: Male    Birth control/protection: Condom    Comment: condoms occ.  Other Topics Concern   Not on file  Social History Narrative   Not on file   Social Determinants of Health   Financial Resource Strain: Not on file  Food Insecurity: Not on file  Transportation Needs: Not on file  Physical Activity: Not on file  Stress: Not on file  Social Connections: Not on file  Intimate Partner Violence: Not on file     PHYSICAL EXAM:  VS: Ht 5\' 5"  (1.651 m)   Wt 262 lb (118.8 kg)   BMI 43.60 kg/m  Physical Exam Gen: NAD, alert, cooperative with exam, well-appearing   Limited ultrasound: Right thumb,  left thumb:  There is no effusion appreciated within each [facility] joint. Left CMC has an effusion with hyperemia and a spur appreciated  Summary: Effusion and degenerative change of the CMC joints.  Ultrasound and interpretation by Clare Gandy, MD    ASSESSMENT & PLAN:   Arthritis of carpometacarpal [facility]) joint of right thumb Having effusion on exam.  Does repetitive activity and pain is intermittent in nature. -Counseled on home exercise therapy and supportive care. -Brace today. -pennsaid    Arthritis of carpometacarpal (CMC) joint of left thumb Having more degenerative change and hyperemia on the left side. -Counseled on home exercise therapy and supportive care. -Brace. -Could consider injection or physical therapy.  Hamstring strain, right, initial encounter Clinically more suggestive hamstring as opposed to radicular type pain.  Has had similar pain in the past that did get improvement with previous  therapy. -Counseled on home exercise therapy and supportive care. -Consider physical therapy or shockwave therapy.

## 2021-04-08 NOTE — Assessment & Plan Note (Signed)
Clinically more suggestive hamstring as opposed to radicular type pain.  Has had similar pain in the past that did get improvement with previous therapy. -Counseled on home exercise therapy and supportive care. -Consider physical therapy or shockwave therapy.

## 2021-04-08 NOTE — Patient Instructions (Signed)
Nice to meet you Please try the exercises  Please try the brace  Please try heat on the leg and ice on the thumb  Please send me a message in MyChart with any questions or updates.  Please see me back in 4 weeks.   --Dr. Jordan Likes

## 2021-04-08 NOTE — Progress Notes (Signed)
Chief Complaint: For colon  Referring Provider:  Paulina Fusi, MD      ASSESSMENT AND PLAN;   #1. FH of colon cancer (mother and father at appox age 57). Neg colon 11/2014 except for mod sigmoid diverticulosis.   Plan: - Colon with Clenpiq   Discussed risks & benefits of colonoscopy. Risks including rare perforation req laparotomy, bleeding after bx/polypectomy req blood transfusion, rarely missing neoplasms, risks of anesthesia/sedation, rare risk of damage to internal organs. Benefits outweigh the risks. Patient agrees to proceed. All the questions were answered. Pt consents to proceed.  HPI:    Brandi Simmons is a 55 y.o. female  With diet controlled DM, Kidney stones.  For colonoscopy.  No nausea, vomiting, heartburn, regurgitation, odynophagia or dysphagia.  No significant diarrhea or constipation.  No melena or hematochezia. No unintentional weight loss. No abdominal pain.  Wt Readings from Last 3 Encounters:  04/08/21 263 lb (119.3 kg)  08/23/18 270 lb 6.4 oz (122.7 kg)  08/01/18 266 lb 2 oz (120.7 kg)     -GI procedures: Colonoscopy 11/2014 moderate sigmoid diverticulosis.  Was recommended to repeat in 3 years.  However, patient agreeable to repeat in 5 years.  Certainly, earlier if still with problems. Past Medical History:  Diagnosis Date   Borderline diabetes mellitus    Family history of adverse reaction to anesthesia    daughter has PONV   Fatty liver    Genital warts    removed from cervix in her 20's   History of kidney stones    HTN (hypertension)    Hyperlipidemia    Hypertension    IBS (irritable bowel syndrome)    Osteoarthritis    Pre-diabetes    Sleep apnea    uses cpap   Urinary incontinence     Past Surgical History:  Procedure Laterality Date   APPENDECTOMY     COLONOSCOPY  11/28/2014   Moderate sigmoid diverticilosis. Small internal hemorrhoids. Otherwise normal colonoscopy.    CYSTOSCOPY/URETEROSCOPY/HOLMIUM LASER/STENT  PLACEMENT Left 08/23/2018   Procedure: LEFT URETEROSCOPY/HOLMIUM LASER/STENT PLACEMENT;  Surgeon: Crist Fat, MD;  Location: [facility];  Service: Urology;  Laterality: Left;   EXTRACORPOREAL SHOCK WAVE LITHOTRIPSY     EYE SURGERY     as a child    Family History  Problem Relation Age of Onset   Cancer Mother 62       colon cancer   Diabetes Mother    Hypertension Mother    Cancer Father 104       colon cancer   Diabetes Father    Stroke Father    Heart failure Father    Other Sister        died from overdose age 7   Esophageal cancer Neg Hx    Breast cancer Neg Hx     Social History   Tobacco Use   Smoking status: Former    Types: Cigarettes    Quit date: 07/04/1994    Years since quitting: 26.7   Smokeless tobacco: Never  Vaping Use   Vaping Use: Never used  Substance Use Topics   Alcohol use: No   Drug use: No    Current Outpatient Medications  Medication Sig Dispense Refill   naproxen (NAPROSYN) 500 MG tablet Take 1 tablet by mouth daily.     phentermine (ADIPEX-P) 37.5 MG tablet Take 1 tablet by mouth daily.     No current facility-administered medications for this visit.    No Known  Allergies  Review of Systems:  Constitutional: Denies fever, chills, diaphoresis, appetite change and fatigue.  HEENT: Denies photophobia, eye pain, redness, hearing loss, ear pain, congestion, sore throat, rhinorrhea, sneezing, mouth sores, neck pain, neck stiffness and tinnitus.   Respiratory: Denies SOB, DOE, cough, chest tightness,  and wheezing.   Cardiovascular: Denies chest pain, palpitations and leg swelling.  Genitourinary: Denies dysuria, urgency, frequency, hematuria, flank pain and difficulty urinating.  Musculoskeletal: Denies myalgias, back pain, joint swelling, arthralgias and gait problem.  Skin: No rash.  Neurological: Denies dizziness, seizures, syncope, weakness, light-headedness, numbness and headaches.  Hematological: Denies  adenopathy. Easy bruising, personal or family bleeding history  Psychiatric/Behavioral:Has anxiety, no depression     Physical Exam:    BP 130/78   Pulse 64   Ht 5\' 5"  (1.651 m)   Wt 263 lb (119.3 kg)   SpO2 96%   BMI 43.77 kg/m  Filed Weights   04/08/21 1443  Weight: 263 lb (119.3 kg)   Constitutional:  Well-developed, in no acute distress. Psychiatric: Normal mood and affect. Behavior is normal. HEENT: Pupils normal.  Conjunctivae are normal. No scleral icterus. Neck supple.  Cardiovascular: Normal rate, regular rhythm. No edema Pulmonary/chest: Effort normal and breath sounds normal. No wheezing, rales or rhonchi. Abdominal: Soft, nondistended.  Mild left lower quadrant tenderness without rebound. Bowel sounds active throughout. There are no masses palpable. No hepatomegaly. Rectal:  defered Neurological: Alert and oriented to person place and time. Skin: Skin is warm and dry. No rashes noted.    06/08/21, MD 04/08/2021, 2:49 PM  Cc: 06/08/2021, MD

## 2021-04-08 NOTE — Assessment & Plan Note (Signed)
Having more degenerative change and hyperemia on the left side. -Counseled on home exercise therapy and supportive care. -Brace. -Could consider injection or physical therapy.

## 2021-04-09 NOTE — Addendum Note (Signed)
Addended by: Alberteen Sam E on: 04/09/2021 10:49 AM   Modules accepted: Orders

## 2021-05-20 DIAGNOSIS — Z1231 Encounter for screening mammogram for malignant neoplasm of breast: Secondary | ICD-10-CM | POA: Diagnosis not present

## 2021-05-26 DIAGNOSIS — G4733 Obstructive sleep apnea (adult) (pediatric): Secondary | ICD-10-CM | POA: Diagnosis not present

## 2021-06-08 ENCOUNTER — Encounter: Payer: Self-pay | Admitting: Gastroenterology

## 2021-06-08 ENCOUNTER — Other Ambulatory Visit: Payer: Self-pay

## 2021-06-08 ENCOUNTER — Ambulatory Visit (AMBULATORY_SURGERY_CENTER): Payer: Federal, State, Local not specified - PPO | Admitting: Gastroenterology

## 2021-06-08 VITALS — BP 130/60 | HR 63 | Temp 99.3°F | Resp 17 | Ht 65.0 in | Wt 263.0 lb

## 2021-06-08 DIAGNOSIS — Z1211 Encounter for screening for malignant neoplasm of colon: Secondary | ICD-10-CM

## 2021-06-08 DIAGNOSIS — Z800 Family history of malignant neoplasm of digestive organs: Secondary | ICD-10-CM | POA: Diagnosis not present

## 2021-06-08 MED ORDER — SODIUM CHLORIDE 0.9 % IV SOLN: 500.0000 mL | Freq: Once | INTRAVENOUS | Status: DC

## 2021-06-08 NOTE — Progress Notes (Signed)
Vs by DT. 

## 2021-06-08 NOTE — Patient Instructions (Signed)
Handouts given for diverticulosis and high fiber diet.  YOU HAD AN ENDOSCOPIC PROCEDURE TODAY AT THE [facility]:   Refer to the procedure report that was given to you for any specific questions about what was found during the examination.  If the procedure report does not answer your questions, please call your gastroenterologist to clarify.  If you requested that your care partner not be given the details of your procedure findings, then the procedure report has been included in a sealed envelope for you to review at your convenience later.  YOU SHOULD EXPECT: Some feelings of bloating in the abdomen. Passage of more gas than usual.  Walking can help get rid of the air that was put into your GI tract during the procedure and reduce the bloating. If you had a lower endoscopy (such as a colonoscopy or flexible sigmoidoscopy) you may notice spotting of blood in your stool or on the toilet paper. If you underwent a bowel prep for your procedure, you may not have a normal bowel movement for a few days.  Please Note:  You might notice some irritation and congestion in your nose or some drainage.  This is from the oxygen used during your procedure.  There is no need for concern and it should clear up in a day or so.  SYMPTOMS TO REPORT IMMEDIATELY:  Following lower endoscopy (colonoscopy or flexible sigmoidoscopy):  Excessive amounts of blood in the stool  Significant tenderness or worsening of abdominal pains  Swelling of the abdomen that is new, acute  Fever of 100F or higher  For urgent or emergent issues, a gastroenterologist can be reached at any hour by calling [phone]. Do not use MyChart messaging for urgent concerns.    DIET:  We do recommend a small meal at first, but then you may proceed to your regular diet.  Drink plenty of fluids but you should avoid alcoholic beverages for 24 hours.  ACTIVITY:  You should plan to take it easy for the rest of today and you should  NOT DRIVE or use heavy machinery until tomorrow (because of the sedation medicines used during the test).    FOLLOW UP: Our staff will call the number listed on your records 48-72 hours following your procedure to check on you and address any questions or concerns that you may have regarding the information given to you following your procedure. If we do not reach you, we will leave a message.  We will attempt to reach you two times.  During this call, we will ask if you have developed any symptoms of COVID 19. If you develop any symptoms (ie: fever, flu-like symptoms, shortness of breath, cough etc.) before then, please call [phone].  If you test positive for Covid 19 in the 2 weeks post procedure, please call and report this information to [place].    There were no polyps seen today!  You will need another screening colonoscopy in 5 years, you will receive a letter at that time when you are due for the procedure.   Please call us at [phone] if you have a change in bowel habits, change in family history of colo-rectal cancer, rectal bleeding or other GI concern before that time.    SIGNATURES/CONFIDENTIALITY: You and/or your care partner have signed paperwork which will be entered into your electronic medical record.  These signatures attest to the fact that that the information above on your After Visit Summary has been reviewed and is understood.  Full responsibility of the confidentiality of this discharge information lies with you and/or your care-partner.  

## 2021-06-08 NOTE — Progress Notes (Signed)
Chief Complaint: For colon  Referring Provider:  Paulina Fusi, MD      ASSESSMENT AND PLAN;   #1. FH of colon cancer (mother and father at appox age 57). Neg colon 11/2014 except for mod sigmoid diverticulosis.   Plan: - Colon with Clenpiq   Discussed risks & benefits of colonoscopy. Risks including rare perforation req laparotomy, bleeding after bx/polypectomy req blood transfusion, rarely missing neoplasms, risks of anesthesia/sedation, rare risk of damage to internal organs. Benefits outweigh the risks. Patient agrees to proceed. All the questions were answered. Pt consents to proceed.  HPI:    Brandi Simmons is a 55 y.o. female  With diet controlled DM, Kidney stones.  For colonoscopy.  No nausea, vomiting, heartburn, regurgitation, odynophagia or dysphagia.  No significant diarrhea or constipation.  No melena or hematochezia. No unintentional weight loss. No abdominal pain.  Wt Readings from Last 3 Encounters:  06/08/21 263 lb (119.3 kg)  04/08/21 262 lb (118.8 kg)  04/08/21 263 lb (119.3 kg)     -GI procedures: Colonoscopy 11/2014 moderate sigmoid diverticulosis.  Was recommended to repeat in 3 years.  However, patient agreeable to repeat in 5 years.  Certainly, earlier if still with problems. Past Medical History:  Diagnosis Date   Borderline diabetes mellitus    Family history of adverse reaction to anesthesia    daughter has PONV   Fatty liver    Genital warts    removed from cervix in her 20's   History of kidney stones    HTN (hypertension)    Hyperlipidemia    Hypertension    IBS (irritable bowel syndrome)    Osteoarthritis    Pre-diabetes    Sleep apnea    uses cpap   Urinary incontinence     Past Surgical History:  Procedure Laterality Date   APPENDECTOMY     COLONOSCOPY  11/28/2014   Moderate sigmoid diverticilosis. Small internal hemorrhoids. Otherwise normal colonoscopy.    CYSTOSCOPY/URETEROSCOPY/HOLMIUM LASER/STENT PLACEMENT Left  08/23/2018   Procedure: LEFT URETEROSCOPY/HOLMIUM LASER/STENT PLACEMENT;  Surgeon: Crist Fat, MD;  Location: [facility];  Service: Urology;  Laterality: Left;   EXTRACORPOREAL SHOCK WAVE LITHOTRIPSY     EYE SURGERY     as a child    Family History  Problem Relation Age of Onset   Cancer Mother 36       colon cancer   Diabetes Mother    Hypertension Mother    Cancer Father 80       colon cancer   Diabetes Father    Stroke Father    Heart failure Father    Other Sister        died from overdose age 81   Esophageal cancer Neg Hx    Breast cancer Neg Hx     Social History   Tobacco Use   Smoking status: Former    Types: Cigarettes    Quit date: 07/04/1994    Years since quitting: 26.9   Smokeless tobacco: Never  Vaping Use   Vaping Use: Never used  Substance Use Topics   Alcohol use: No   Drug use: No    Current Outpatient Medications  Medication Sig Dispense Refill   benazepril (LOTENSIN) 20 MG tablet Take 20 mg by mouth daily.     naproxen (NAPROSYN) 500 MG tablet Take 1 tablet by mouth daily.     phentermine (ADIPEX-P) 37.5 MG tablet Take 1 tablet by mouth daily.     Current  Facility-Administered Medications  Medication Dose Route Frequency Provider Last Rate Last Admin   0.9 %  sodium chloride infusion  500 mL Intravenous Once Jackquline Denmark, MD        No Known Allergies  Review of Systems:  Constitutional: Denies fever, chills, diaphoresis, appetite change and fatigue.  HEENT: Denies photophobia, eye pain, redness, hearing loss, ear pain, congestion, sore throat, rhinorrhea, sneezing, mouth sores, neck pain, neck stiffness and tinnitus.   Respiratory: Denies SOB, DOE, cough, chest tightness,  and wheezing.   Cardiovascular: Denies chest pain, palpitations and leg swelling.  Genitourinary: Denies dysuria, urgency, frequency, hematuria, flank pain and difficulty urinating.  Musculoskeletal: Denies myalgias, back pain, joint swelling,  arthralgias and gait problem.  Skin: No rash.  Neurological: Denies dizziness, seizures, syncope, weakness, light-headedness, numbness and headaches.  Hematological: Denies adenopathy. Easy bruising, personal or family bleeding history  Psychiatric/Behavioral:Has anxiety, no depression     Physical Exam:    BP 133/63   Pulse 68   Temp 99.3 F (37.4 C)   Ht 5\' 5"  (1.651 m)   Wt 263 lb (119.3 kg)   SpO2 99%   BMI 43.77 kg/m  Filed Weights   06/08/21 1022  Weight: 263 lb (119.3 kg)   Constitutional:  Well-developed, in no acute distress. Psychiatric: Normal mood and affect. Behavior is normal. HEENT: Pupils normal.  Conjunctivae are normal. No scleral icterus. Neck supple.  Cardiovascular: Normal rate, regular rhythm. No edema Pulmonary/chest: Effort normal and breath sounds normal. No wheezing, rales or rhonchi. Abdominal: Soft, nondistended.  Mild left lower quadrant tenderness without rebound. Bowel sounds active throughout. There are no masses palpable. No hepatomegaly. Rectal:  defered Neurological: Alert and oriented to person place and time. Skin: Skin is warm and dry. No rashes noted.    Carmell Austria, MD 06/08/2021, 10:35 AM  Cc: Nicoletta Dress, MD

## 2021-06-08 NOTE — Progress Notes (Signed)
To PACU, VSS. Report to [email or web address] 

## 2021-06-08 NOTE — Op Note (Signed)
[facility] Patient Name: Brandi Simmons Procedure Date: 06/08/2021 10:34 AM MRN: [id number] Endoscopist: Lynann Bologna , MD Age: 55 Referring MD:  Date of Birth: [birth year] Gender: Female Account #: [id number] Procedure:                Colonoscopy Indications:              Screening in patient at increased risk: Colorectal                            cancer in mother and father at age >52 Medicines:                Monitored Anesthesia Care Procedure:                Pre-Anesthesia Assessment:                           - Prior to the procedure, a History and Physical                            was performed, and patient medications and                            allergies were reviewed. The patient's tolerance of                            previous anesthesia was also reviewed. The risks                            and benefits of the procedure and the sedation                            options and risks were discussed with the patient.                            All questions were answered, and informed consent                            was obtained. Prior Anticoagulants: The patient has                            taken no previous anticoagulant or antiplatelet                            agents. ASA Grade Assessment: II - A patient with                            mild systemic disease. After reviewing the risks                            and benefits, the patient was deemed in                            satisfactory condition to undergo the procedure.  After obtaining informed consent, the colonoscope                            was passed under direct vision. Throughout the                            procedure, the patient's blood pressure, pulse, and                            oxygen saturations were monitored continuously. The                            CF [id number] #[id number] was introduced through the anus                            and advanced to the 2  cm into the ileum. The                            colonoscopy was performed without difficulty. The                            patient tolerated the procedure well. The quality                            of the bowel preparation was good. The ileocecal                            valve, appendiceal orifice, and rectum were                            photographed. Scope In: 10:44:42 AM Scope Out: 10:54:51 AM Scope Withdrawal Time: 0 hours 7 minutes 21 seconds  Total Procedure Duration: 0 hours 10 minutes 9 seconds  Findings:                 Multiple medium-mouthed diverticula were found in                            the sigmoid colon and transverse colon.                           Non-bleeding internal hemorrhoids were found during                            retroflexion. The hemorrhoids were small and Grade                            I (internal hemorrhoids that do not prolapse).                           The terminal ileum appeared normal.                           The exam was otherwise without abnormality on  direct and retroflexion views. Complications:            No immediate complications. Estimated Blood Loss:     Estimated blood loss: none. Impression:               - Moderate colonic diverticulosis.                           - Non-bleeding internal hemorrhoids.                           - The examined portion of the ileum was normal.                           - The examination was otherwise normal on direct                            and retroflexion views.                           - No specimens collected. Recommendation:           - Patient has a contact number available for                            emergencies. The signs and symptoms of potential                            delayed complications were discussed with the                            patient. Return to normal activities tomorrow.                            Written discharge instructions  were provided to the                            patient.                           - High fiber diet.                           - Continue present medications.                           - Repeat colonoscopy in 5 years for screening                            purposes. Earlier, if with any new problems or                            change in family history.                           - The findings and recommendations were discussed  with the patient's family. Lynann Bologna, MD 06/08/2021 10:59:51 AM This report has been signed electronically.

## 2021-06-09 ENCOUNTER — Other Ambulatory Visit: Payer: Self-pay

## 2021-06-10 ENCOUNTER — Telehealth: Payer: Self-pay

## 2021-06-10 NOTE — Telephone Encounter (Signed)
  Follow up Call-  Call back number 06/08/2021  Post procedure Call Back phone  # [phone]  Permission to leave phone message Yes  Some recent data might be hidden     Patient questions:  Do you have a fever, pain , or abdominal swelling? No. Pain Score  0 *  Have you tolerated food without any problems? Yes.    Have you been able to return to your normal activities? Yes.    Do you have any questions about your discharge instructions: Diet   No. Medications  No. Follow up visit  No.  Do you have questions or concerns about your Care? No.  Actions: * If pain score is 4 or above: No action needed, pain <4.

## 2021-06-14 DIAGNOSIS — Z6841 Body Mass Index (BMI) 40.0 and over, adult: Secondary | ICD-10-CM | POA: Diagnosis not present

## 2021-06-14 DIAGNOSIS — Z01419 Encounter for gynecological examination (general) (routine) without abnormal findings: Secondary | ICD-10-CM | POA: Diagnosis not present

## 2021-06-21 DIAGNOSIS — Z0000 Encounter for general adult medical examination without abnormal findings: Secondary | ICD-10-CM | POA: Diagnosis not present

## 2021-06-24 DIAGNOSIS — N200 Calculus of kidney: Secondary | ICD-10-CM | POA: Diagnosis not present

## 2021-06-25 DIAGNOSIS — G4733 Obstructive sleep apnea (adult) (pediatric): Secondary | ICD-10-CM | POA: Diagnosis not present

## 2021-07-26 ENCOUNTER — Ambulatory Visit: Payer: Federal, State, Local not specified - PPO | Admitting: Family Medicine

## 2021-07-26 DIAGNOSIS — G4733 Obstructive sleep apnea (adult) (pediatric): Secondary | ICD-10-CM | POA: Diagnosis not present

## 2021-08-02 ENCOUNTER — Ambulatory Visit: Payer: Self-pay

## 2021-08-02 ENCOUNTER — Ambulatory Visit: Payer: Federal, State, Local not specified - PPO | Admitting: Family Medicine

## 2021-08-02 VITALS — BP 128/70 | Ht 65.0 in | Wt 257.0 lb

## 2021-08-02 DIAGNOSIS — M79672 Pain in left foot: Secondary | ICD-10-CM | POA: Diagnosis not present

## 2021-08-02 DIAGNOSIS — M76822 Posterior tibial tendinitis, left leg: Secondary | ICD-10-CM

## 2021-08-02 MED ORDER — PREDNISONE 5 MG PO TABS: ORAL_TABLET | ORAL | 0 refills | Status: AC

## 2021-08-02 NOTE — Patient Instructions (Signed)
Good to see you Please use compression  Please use the insoles  Please avoid walking barefoot  Please stop the naproxen while on the prednisone   Please send me a message in MyChart with any questions or updates.  Please see me back in 4 weeks.   --Dr. Jordan Likes

## 2021-08-02 NOTE — Assessment & Plan Note (Signed)
Acutely occurring.  Has changes at the insertion of the posterior tibialis. -Counseled on home exercise therapy and supportive care. -Prednisone. -Green sport insoles with scaphoid pad.  She has fairly flat feet. -Could consider cam walker physical therapy.

## 2021-08-02 NOTE — Progress Notes (Signed)
°  Brandi Simmons - 56 y.o. female MRN [id number]  Date of birth: October 29, 1965  SUBJECTIVE:  Including CC & ROS.  No chief complaint on file.   Brandi Simmons is a 56 y.o. female that is presenting with acute left foot pain.  The pain is been ongoing for about 3 to 4 weeks.  The pain is occurring over the medial midfoot.  No injury or inciting event.  Worse with standing and walking.    Review of Systems See HPI   HISTORY: Past Medical, Surgical, Social, and Family History Reviewed & Updated per EMR.   Pertinent Historical Findings include:  Past Medical History:  Diagnosis Date   Borderline diabetes mellitus    Family history of adverse reaction to anesthesia    daughter has PONV   Fatty liver    Genital warts    removed from cervix in her 20's   History of kidney stones    HTN (hypertension)    Hyperlipidemia    Hypertension    IBS (irritable bowel syndrome)    Osteoarthritis    Pre-diabetes    Sleep apnea    uses cpap   Urinary incontinence     Past Surgical History:  Procedure Laterality Date   APPENDECTOMY     COLONOSCOPY  11/28/2014   Moderate sigmoid diverticilosis. Small internal hemorrhoids. Otherwise normal colonoscopy.    CYSTOSCOPY/URETEROSCOPY/HOLMIUM LASER/STENT PLACEMENT Left 08/23/2018   Procedure: LEFT URETEROSCOPY/HOLMIUM LASER/STENT PLACEMENT;  Surgeon: Ardis Hughs, MD;  Location: [facility];  Service: Urology;  Laterality: Left;   EXTRACORPOREAL SHOCK WAVE LITHOTRIPSY     EYE SURGERY     as a child     PHYSICAL EXAM:  VS: BP 128/70    Ht 5\' 5"  (1.651 m)    Wt 257 lb (116.6 kg)    BMI 42.77 kg/m  Physical Exam Gen: NAD, alert, cooperative with exam, well-appearing MSK:  Neurovascularly intact    Limited ultrasound: Left foot:  Mild effusion of the posterior tibialis at the medial malleolus. Hypoechoic change near the insertion with increased hyperemia at the insertion of the posterior tibialis into the navicular. No changes  over the navicular or talus  Summary: Insertional posterior tibialis tendinitis  Ultrasound and interpretation by Clearance Coots, MD    ASSESSMENT & PLAN:   Posterior tibial tendinitis of left leg Acutely occurring.  Has changes at the insertion of the posterior tibialis. -Counseled on home exercise therapy and supportive care. -Prednisone. -Green sport insoles with scaphoid pad.  She has fairly flat feet. -Could consider cam walker physical therapy.

## 2021-09-06 ENCOUNTER — Ambulatory Visit: Payer: Federal, State, Local not specified - PPO | Admitting: Family Medicine

## 2021-09-06 DIAGNOSIS — G4733 Obstructive sleep apnea (adult) (pediatric): Secondary | ICD-10-CM | POA: Diagnosis not present

## 2021-12-06 DIAGNOSIS — G4733 Obstructive sleep apnea (adult) (pediatric): Secondary | ICD-10-CM | POA: Diagnosis not present

## 2021-12-07 DIAGNOSIS — G4733 Obstructive sleep apnea (adult) (pediatric): Secondary | ICD-10-CM | POA: Diagnosis not present

## 2021-12-20 DIAGNOSIS — I10 Essential (primary) hypertension: Secondary | ICD-10-CM | POA: Diagnosis not present

## 2021-12-20 DIAGNOSIS — E1169 Type 2 diabetes mellitus with other specified complication: Secondary | ICD-10-CM | POA: Diagnosis not present

## 2021-12-20 DIAGNOSIS — E559 Vitamin D deficiency, unspecified: Secondary | ICD-10-CM | POA: Diagnosis not present

## 2021-12-20 DIAGNOSIS — E785 Hyperlipidemia, unspecified: Secondary | ICD-10-CM | POA: Diagnosis not present

## 2022-01-05 DIAGNOSIS — G4733 Obstructive sleep apnea (adult) (pediatric): Secondary | ICD-10-CM | POA: Diagnosis not present

## 2022-02-05 DIAGNOSIS — G4733 Obstructive sleep apnea (adult) (pediatric): Secondary | ICD-10-CM | POA: Diagnosis not present

## 2022-03-22 DIAGNOSIS — G4733 Obstructive sleep apnea (adult) (pediatric): Secondary | ICD-10-CM | POA: Diagnosis not present

## 2022-04-21 DIAGNOSIS — G4733 Obstructive sleep apnea (adult) (pediatric): Secondary | ICD-10-CM | POA: Diagnosis not present

## 2022-05-18 DIAGNOSIS — Z1231 Encounter for screening mammogram for malignant neoplasm of breast: Secondary | ICD-10-CM | POA: Diagnosis not present

## 2022-05-18 DIAGNOSIS — Z01419 Encounter for gynecological examination (general) (routine) without abnormal findings: Secondary | ICD-10-CM | POA: Diagnosis not present

## 2022-05-22 DIAGNOSIS — G4733 Obstructive sleep apnea (adult) (pediatric): Secondary | ICD-10-CM | POA: Diagnosis not present

## 2022-06-20 DIAGNOSIS — G4733 Obstructive sleep apnea (adult) (pediatric): Secondary | ICD-10-CM | POA: Diagnosis not present

## 2022-06-22 DIAGNOSIS — Z1331 Encounter for screening for depression: Secondary | ICD-10-CM | POA: Diagnosis not present

## 2022-06-22 DIAGNOSIS — Z0000 Encounter for general adult medical examination without abnormal findings: Secondary | ICD-10-CM | POA: Diagnosis not present

## 2022-06-23 DIAGNOSIS — N200 Calculus of kidney: Secondary | ICD-10-CM | POA: Diagnosis not present

## 2022-07-21 DIAGNOSIS — G4733 Obstructive sleep apnea (adult) (pediatric): Secondary | ICD-10-CM | POA: Diagnosis not present

## 2022-08-21 DIAGNOSIS — G4733 Obstructive sleep apnea (adult) (pediatric): Secondary | ICD-10-CM | POA: Diagnosis not present

## 2022-10-17 ENCOUNTER — Encounter: Payer: Self-pay | Admitting: *Deleted

## 2022-12-23 DIAGNOSIS — E785 Hyperlipidemia, unspecified: Secondary | ICD-10-CM | POA: Diagnosis not present

## 2022-12-23 DIAGNOSIS — E559 Vitamin D deficiency, unspecified: Secondary | ICD-10-CM | POA: Diagnosis not present

## 2022-12-23 DIAGNOSIS — R809 Proteinuria, unspecified: Secondary | ICD-10-CM | POA: Diagnosis not present

## 2022-12-23 DIAGNOSIS — E1169 Type 2 diabetes mellitus with other specified complication: Secondary | ICD-10-CM | POA: Diagnosis not present

## 2022-12-23 DIAGNOSIS — I10 Essential (primary) hypertension: Secondary | ICD-10-CM | POA: Diagnosis not present

## 2023-02-10 DIAGNOSIS — M7712 Lateral epicondylitis, left elbow: Secondary | ICD-10-CM | POA: Diagnosis not present

## 2023-03-03 DIAGNOSIS — M24822 Other specific joint derangements of left elbow, not elsewhere classified: Secondary | ICD-10-CM | POA: Diagnosis not present

## 2023-03-03 DIAGNOSIS — M67422 Ganglion, left elbow: Secondary | ICD-10-CM | POA: Diagnosis not present

## 2023-03-03 DIAGNOSIS — M67824 Other specified disorders of tendon, left elbow: Secondary | ICD-10-CM | POA: Diagnosis not present

## 2023-03-03 DIAGNOSIS — R600 Localized edema: Secondary | ICD-10-CM | POA: Diagnosis not present

## 2023-03-03 DIAGNOSIS — M7712 Lateral epicondylitis, left elbow: Secondary | ICD-10-CM | POA: Diagnosis not present

## 2023-03-03 DIAGNOSIS — Z87891 Personal history of nicotine dependence: Secondary | ICD-10-CM | POA: Diagnosis not present

## 2023-06-06 DIAGNOSIS — Z1231 Encounter for screening mammogram for malignant neoplasm of breast: Secondary | ICD-10-CM | POA: Diagnosis not present

## 2023-06-06 DIAGNOSIS — Z124 Encounter for screening for malignant neoplasm of cervix: Secondary | ICD-10-CM | POA: Diagnosis not present

## 2023-06-06 DIAGNOSIS — Z01419 Encounter for gynecological examination (general) (routine) without abnormal findings: Secondary | ICD-10-CM | POA: Diagnosis not present

## 2023-06-08 ENCOUNTER — Other Ambulatory Visit: Payer: Self-pay | Admitting: Obstetrics

## 2023-06-08 DIAGNOSIS — R928 Other abnormal and inconclusive findings on diagnostic imaging of breast: Secondary | ICD-10-CM

## 2023-06-19 ENCOUNTER — Ambulatory Visit
Admission: RE | Admit: 2023-06-19 | Discharge: 2023-06-19 | Disposition: A | Discharge status: Home / Self Care | Payer: Federal, State, Local not specified - PPO | Source: Ambulatory Visit | Attending: Obstetrics | Admitting: Obstetrics

## 2023-06-19 DIAGNOSIS — R921 Mammographic calcification found on diagnostic imaging of breast: Secondary | ICD-10-CM | POA: Diagnosis not present

## 2023-06-19 DIAGNOSIS — R928 Other abnormal and inconclusive findings on diagnostic imaging of breast: Secondary | ICD-10-CM

## 2023-06-20 ENCOUNTER — Other Ambulatory Visit: Payer: Self-pay | Admitting: Obstetrics

## 2023-06-20 DIAGNOSIS — R921 Mammographic calcification found on diagnostic imaging of breast: Secondary | ICD-10-CM

## 2023-06-21 ENCOUNTER — Ambulatory Visit
Admission: RE | Admit: 2023-06-21 | Discharge: 2023-06-21 | Disposition: A | Discharge status: Home / Self Care | Payer: Federal, State, Local not specified - PPO | Source: Ambulatory Visit | Attending: Obstetrics | Admitting: Obstetrics

## 2023-06-21 ENCOUNTER — Ambulatory Visit
Admission: RE | Admit: 2023-06-21 | Discharge: 2023-06-21 | Disposition: A | Discharge status: Home / Self Care | Payer: Federal, State, Local not specified - PPO | Source: Ambulatory Visit | Attending: Obstetrics

## 2023-06-21 DIAGNOSIS — R921 Mammographic calcification found on diagnostic imaging of breast: Secondary | ICD-10-CM

## 2023-06-21 DIAGNOSIS — N6489 Other specified disorders of breast: Secondary | ICD-10-CM | POA: Diagnosis not present

## 2023-06-21 DIAGNOSIS — N6314 Unspecified lump in the right breast, lower inner quadrant: Secondary | ICD-10-CM | POA: Diagnosis not present

## 2023-06-21 HISTORY — PX: BREAST BIOPSY: SHX20

## 2023-06-22 LAB — SURGICAL PATHOLOGY

## 2023-06-23 DIAGNOSIS — E559 Vitamin D deficiency, unspecified: Secondary | ICD-10-CM | POA: Diagnosis not present

## 2023-06-23 DIAGNOSIS — E785 Hyperlipidemia, unspecified: Secondary | ICD-10-CM | POA: Diagnosis not present

## 2023-06-23 DIAGNOSIS — R809 Proteinuria, unspecified: Secondary | ICD-10-CM | POA: Diagnosis not present

## 2023-06-23 DIAGNOSIS — I10 Essential (primary) hypertension: Secondary | ICD-10-CM | POA: Diagnosis not present

## 2023-06-23 DIAGNOSIS — E1169 Type 2 diabetes mellitus with other specified complication: Secondary | ICD-10-CM | POA: Diagnosis not present

## 2023-07-31 DIAGNOSIS — M722 Plantar fascial fibromatosis: Secondary | ICD-10-CM | POA: Diagnosis not present

## 2023-07-31 DIAGNOSIS — M21611 Bunion of right foot: Secondary | ICD-10-CM | POA: Diagnosis not present

## 2023-08-25 DIAGNOSIS — L84 Corns and callosities: Secondary | ICD-10-CM | POA: Diagnosis not present

## 2023-08-25 DIAGNOSIS — M79671 Pain in right foot: Secondary | ICD-10-CM | POA: Diagnosis not present

## 2023-08-25 DIAGNOSIS — M2142 Flat foot [pes planus] (acquired), left foot: Secondary | ICD-10-CM | POA: Diagnosis not present

## 2023-08-25 DIAGNOSIS — M25572 Pain in left ankle and joints of left foot: Secondary | ICD-10-CM | POA: Diagnosis not present

## 2023-12-22 DIAGNOSIS — K760 Fatty (change of) liver, not elsewhere classified: Secondary | ICD-10-CM | POA: Diagnosis not present

## 2023-12-22 DIAGNOSIS — Z0000 Encounter for general adult medical examination without abnormal findings: Secondary | ICD-10-CM | POA: Diagnosis not present

## 2023-12-22 DIAGNOSIS — E559 Vitamin D deficiency, unspecified: Secondary | ICD-10-CM | POA: Diagnosis not present

## 2023-12-22 DIAGNOSIS — Z1331 Encounter for screening for depression: Secondary | ICD-10-CM | POA: Diagnosis not present

## 2023-12-22 DIAGNOSIS — E1169 Type 2 diabetes mellitus with other specified complication: Secondary | ICD-10-CM | POA: Diagnosis not present

## 2023-12-22 DIAGNOSIS — E785 Hyperlipidemia, unspecified: Secondary | ICD-10-CM | POA: Diagnosis not present

## 2024-02-21 DIAGNOSIS — Z6841 Body Mass Index (BMI) 40.0 and over, adult: Secondary | ICD-10-CM | POA: Diagnosis not present

## 2024-02-21 DIAGNOSIS — B379 Candidiasis, unspecified: Secondary | ICD-10-CM | POA: Diagnosis not present

## 2024-02-21 DIAGNOSIS — N390 Urinary tract infection, site not specified: Secondary | ICD-10-CM | POA: Diagnosis not present

## 2024-05-06 DIAGNOSIS — N390 Urinary tract infection, site not specified: Secondary | ICD-10-CM | POA: Diagnosis not present

## 2024-05-06 DIAGNOSIS — B379 Candidiasis, unspecified: Secondary | ICD-10-CM | POA: Diagnosis not present

## 2024-06-10 ENCOUNTER — Other Ambulatory Visit (HOSPITAL_COMMUNITY): Payer: Self-pay

## 2024-06-10 MED ORDER — TRETINOIN 0.05 % EX CREA
TOPICAL_CREAM | Freq: Every day | CUTANEOUS | 1 refills | Status: AC
  Filled 2024-06-19: qty 45, 30d supply, fill #0

## 2024-06-11 ENCOUNTER — Other Ambulatory Visit (HOSPITAL_COMMUNITY): Payer: Self-pay

## 2024-06-19 ENCOUNTER — Other Ambulatory Visit (HOSPITAL_BASED_OUTPATIENT_CLINIC_OR_DEPARTMENT_OTHER): Payer: Self-pay

## 2024-06-19 ENCOUNTER — Other Ambulatory Visit: Payer: Self-pay

## 2024-06-19 DIAGNOSIS — Z1231 Encounter for screening mammogram for malignant neoplasm of breast: Secondary | ICD-10-CM | POA: Diagnosis not present

## 2024-06-19 DIAGNOSIS — Z01419 Encounter for gynecological examination (general) (routine) without abnormal findings: Secondary | ICD-10-CM | POA: Diagnosis not present

## 2024-06-19 MED ORDER — ESTRADIOL 0.01 % VA CREA
TOPICAL_CREAM | VAGINAL | 6 refills | Status: AC
  Filled 2024-06-19: qty 42.5, 90d supply, fill #0

## 2024-06-20 ENCOUNTER — Other Ambulatory Visit (HOSPITAL_COMMUNITY): Payer: Self-pay

## 2024-06-21 DIAGNOSIS — E1169 Type 2 diabetes mellitus with other specified complication: Secondary | ICD-10-CM | POA: Diagnosis not present

## 2024-06-21 DIAGNOSIS — E559 Vitamin D deficiency, unspecified: Secondary | ICD-10-CM | POA: Diagnosis not present

## 2024-06-21 DIAGNOSIS — I10 Essential (primary) hypertension: Secondary | ICD-10-CM | POA: Diagnosis not present

## 2024-06-21 DIAGNOSIS — E785 Hyperlipidemia, unspecified: Secondary | ICD-10-CM | POA: Diagnosis not present

## 2024-06-26 ENCOUNTER — Other Ambulatory Visit (HOSPITAL_COMMUNITY): Payer: Self-pay

## 2024-07-02 ENCOUNTER — Other Ambulatory Visit (HOSPITAL_BASED_OUTPATIENT_CLINIC_OR_DEPARTMENT_OTHER): Payer: Self-pay

## 2024-07-02 ENCOUNTER — Encounter (HOSPITAL_BASED_OUTPATIENT_CLINIC_OR_DEPARTMENT_OTHER): Payer: Self-pay

## 2024-07-05 ENCOUNTER — Other Ambulatory Visit (HOSPITAL_COMMUNITY): Payer: Self-pay

## 2024-07-05 ENCOUNTER — Other Ambulatory Visit: Payer: Self-pay

## 2024-07-08 ENCOUNTER — Other Ambulatory Visit (HOSPITAL_BASED_OUTPATIENT_CLINIC_OR_DEPARTMENT_OTHER): Payer: Self-pay

## 2024-07-10 ENCOUNTER — Other Ambulatory Visit (HOSPITAL_COMMUNITY): Payer: Self-pay

## 2024-07-22 ENCOUNTER — Other Ambulatory Visit (HOSPITAL_COMMUNITY): Payer: Self-pay
